# Patient Record
Sex: Male | Born: 1935
Health system: Southern US, Community
[De-identification: ages and names within clinical notes are randomized; demographics above are authoritative.]

## PROBLEM LIST (undated history)

## (undated) DIAGNOSIS — E785 Hyperlipidemia, unspecified: Secondary | ICD-10-CM

## (undated) DIAGNOSIS — I4510 Unspecified right bundle-branch block: Secondary | ICD-10-CM

## (undated) DIAGNOSIS — I6529 Occlusion and stenosis of unspecified carotid artery: Secondary | ICD-10-CM

## (undated) HISTORY — DX: Unspecified right bundle-branch block: I45.10

## (undated) HISTORY — DX: Hyperlipidemia, unspecified: E78.5

## (undated) HISTORY — DX: Occlusion and stenosis of unspecified carotid artery: I65.29

---

## 2002-11-12 ENCOUNTER — Encounter: Payer: Self-pay | Admitting: Emergency Medicine

## 2002-11-12 ENCOUNTER — Emergency Department (HOSPITAL_COMMUNITY): Admission: EM | Admit: 2002-11-12 | Discharge: 2002-11-13 | Payer: Self-pay | Admitting: Emergency Medicine

## 2004-04-09 ENCOUNTER — Encounter: Admission: RE | Admit: 2004-04-09 | Discharge: 2004-04-09 | Payer: Self-pay | Admitting: Internal Medicine

## 2004-07-01 ENCOUNTER — Encounter: Admission: RE | Admit: 2004-07-01 | Discharge: 2004-07-01 | Payer: Self-pay | Admitting: Internal Medicine

## 2004-12-31 ENCOUNTER — Ambulatory Visit (HOSPITAL_COMMUNITY): Admission: RE | Admit: 2004-12-31 | Discharge: 2004-12-31 | Payer: Self-pay | Admitting: Gastroenterology

## 2011-11-24 DIAGNOSIS — H43399 Other vitreous opacities, unspecified eye: Secondary | ICD-10-CM | POA: Diagnosis not present

## 2011-11-24 DIAGNOSIS — H2510 Age-related nuclear cataract, unspecified eye: Secondary | ICD-10-CM | POA: Diagnosis not present

## 2011-11-24 DIAGNOSIS — H35359 Cystoid macular degeneration, unspecified eye: Secondary | ICD-10-CM | POA: Diagnosis not present

## 2012-01-18 DIAGNOSIS — L570 Actinic keratosis: Secondary | ICD-10-CM | POA: Diagnosis not present

## 2012-01-18 DIAGNOSIS — D235 Other benign neoplasm of skin of trunk: Secondary | ICD-10-CM | POA: Diagnosis not present

## 2012-01-27 DIAGNOSIS — R7301 Impaired fasting glucose: Secondary | ICD-10-CM | POA: Diagnosis not present

## 2012-01-27 DIAGNOSIS — I6529 Occlusion and stenosis of unspecified carotid artery: Secondary | ICD-10-CM | POA: Diagnosis not present

## 2012-01-27 DIAGNOSIS — E785 Hyperlipidemia, unspecified: Secondary | ICD-10-CM | POA: Diagnosis not present

## 2012-01-27 DIAGNOSIS — N529 Male erectile dysfunction, unspecified: Secondary | ICD-10-CM | POA: Diagnosis not present

## 2012-01-31 DIAGNOSIS — Z125 Encounter for screening for malignant neoplasm of prostate: Secondary | ICD-10-CM | POA: Diagnosis not present

## 2012-02-14 DIAGNOSIS — Z0000 Encounter for general adult medical examination without abnormal findings: Secondary | ICD-10-CM | POA: Diagnosis not present

## 2012-02-14 DIAGNOSIS — E785 Hyperlipidemia, unspecified: Secondary | ICD-10-CM | POA: Diagnosis not present

## 2012-02-14 DIAGNOSIS — Z1212 Encounter for screening for malignant neoplasm of rectum: Secondary | ICD-10-CM | POA: Diagnosis not present

## 2012-02-14 DIAGNOSIS — I6529 Occlusion and stenosis of unspecified carotid artery: Secondary | ICD-10-CM | POA: Diagnosis not present

## 2012-02-14 DIAGNOSIS — Z125 Encounter for screening for malignant neoplasm of prostate: Secondary | ICD-10-CM | POA: Diagnosis not present

## 2012-02-14 DIAGNOSIS — R7301 Impaired fasting glucose: Secondary | ICD-10-CM | POA: Diagnosis not present

## 2012-02-24 DIAGNOSIS — I6529 Occlusion and stenosis of unspecified carotid artery: Secondary | ICD-10-CM | POA: Diagnosis not present

## 2012-02-24 DIAGNOSIS — E782 Mixed hyperlipidemia: Secondary | ICD-10-CM | POA: Diagnosis not present

## 2012-06-04 DIAGNOSIS — H2510 Age-related nuclear cataract, unspecified eye: Secondary | ICD-10-CM | POA: Diagnosis not present

## 2012-06-08 DIAGNOSIS — H2510 Age-related nuclear cataract, unspecified eye: Secondary | ICD-10-CM | POA: Diagnosis not present

## 2012-06-08 DIAGNOSIS — IMO0002 Reserved for concepts with insufficient information to code with codable children: Secondary | ICD-10-CM | POA: Diagnosis not present

## 2012-06-15 DIAGNOSIS — H2510 Age-related nuclear cataract, unspecified eye: Secondary | ICD-10-CM | POA: Diagnosis not present

## 2012-07-06 DIAGNOSIS — H2589 Other age-related cataract: Secondary | ICD-10-CM | POA: Diagnosis not present

## 2012-07-06 DIAGNOSIS — H2510 Age-related nuclear cataract, unspecified eye: Secondary | ICD-10-CM | POA: Diagnosis not present

## 2012-07-25 DIAGNOSIS — D235 Other benign neoplasm of skin of trunk: Secondary | ICD-10-CM | POA: Diagnosis not present

## 2012-07-25 DIAGNOSIS — L570 Actinic keratosis: Secondary | ICD-10-CM | POA: Diagnosis not present

## 2013-01-18 DIAGNOSIS — H35359 Cystoid macular degeneration, unspecified eye: Secondary | ICD-10-CM | POA: Diagnosis not present

## 2013-01-18 DIAGNOSIS — H26499 Other secondary cataract, unspecified eye: Secondary | ICD-10-CM | POA: Diagnosis not present

## 2013-01-23 DIAGNOSIS — H26499 Other secondary cataract, unspecified eye: Secondary | ICD-10-CM | POA: Diagnosis not present

## 2013-02-08 DIAGNOSIS — D235 Other benign neoplasm of skin of trunk: Secondary | ICD-10-CM | POA: Diagnosis not present

## 2013-02-08 DIAGNOSIS — L570 Actinic keratosis: Secondary | ICD-10-CM | POA: Diagnosis not present

## 2013-02-11 DIAGNOSIS — R7301 Impaired fasting glucose: Secondary | ICD-10-CM | POA: Diagnosis not present

## 2013-02-11 DIAGNOSIS — Z125 Encounter for screening for malignant neoplasm of prostate: Secondary | ICD-10-CM | POA: Diagnosis not present

## 2013-02-11 DIAGNOSIS — E785 Hyperlipidemia, unspecified: Secondary | ICD-10-CM | POA: Diagnosis not present

## 2013-02-18 DIAGNOSIS — M542 Cervicalgia: Secondary | ICD-10-CM | POA: Diagnosis not present

## 2013-02-18 DIAGNOSIS — J309 Allergic rhinitis, unspecified: Secondary | ICD-10-CM | POA: Diagnosis not present

## 2013-02-18 DIAGNOSIS — H26499 Other secondary cataract, unspecified eye: Secondary | ICD-10-CM | POA: Diagnosis not present

## 2013-02-18 DIAGNOSIS — H43819 Vitreous degeneration, unspecified eye: Secondary | ICD-10-CM | POA: Diagnosis not present

## 2013-02-18 DIAGNOSIS — R7301 Impaired fasting glucose: Secondary | ICD-10-CM | POA: Diagnosis not present

## 2013-02-18 DIAGNOSIS — Z0000 Encounter for general adult medical examination without abnormal findings: Secondary | ICD-10-CM | POA: Diagnosis not present

## 2013-02-18 DIAGNOSIS — Z1331 Encounter for screening for depression: Secondary | ICD-10-CM | POA: Diagnosis not present

## 2013-02-18 DIAGNOSIS — E785 Hyperlipidemia, unspecified: Secondary | ICD-10-CM | POA: Diagnosis not present

## 2013-02-18 DIAGNOSIS — I6529 Occlusion and stenosis of unspecified carotid artery: Secondary | ICD-10-CM | POA: Diagnosis not present

## 2013-02-20 DIAGNOSIS — Z1212 Encounter for screening for malignant neoplasm of rectum: Secondary | ICD-10-CM | POA: Diagnosis not present

## 2013-02-25 ENCOUNTER — Other Ambulatory Visit (HOSPITAL_COMMUNITY): Payer: Self-pay | Admitting: Cardiovascular Disease

## 2013-02-25 ENCOUNTER — Ambulatory Visit (HOSPITAL_COMMUNITY)
Admission: RE | Admit: 2013-02-25 | Discharge: 2013-02-25 | Disposition: A | Discharge status: Home / Self Care | Payer: Medicare Other | Source: Ambulatory Visit | Attending: Cardiovascular Disease | Admitting: Cardiovascular Disease

## 2013-02-25 DIAGNOSIS — I658 Occlusion and stenosis of other precerebral arteries: Secondary | ICD-10-CM | POA: Insufficient documentation

## 2013-02-25 DIAGNOSIS — M545 Low back pain: Secondary | ICD-10-CM | POA: Diagnosis not present

## 2013-02-25 DIAGNOSIS — I6529 Occlusion and stenosis of unspecified carotid artery: Secondary | ICD-10-CM | POA: Insufficient documentation

## 2013-02-25 DIAGNOSIS — I6523 Occlusion and stenosis of bilateral carotid arteries: Secondary | ICD-10-CM

## 2013-02-25 DIAGNOSIS — M549 Dorsalgia, unspecified: Secondary | ICD-10-CM | POA: Diagnosis not present

## 2013-02-25 NOTE — Progress Notes (Signed)
Carotid artery duplex doppler was completed. Xavi Tomasik RVT 

## 2013-03-26 DIAGNOSIS — M545 Low back pain: Secondary | ICD-10-CM | POA: Diagnosis not present

## 2013-04-08 ENCOUNTER — Ambulatory Visit (INDEPENDENT_AMBULATORY_CARE_PROVIDER_SITE_OTHER): Payer: Medicare Other | Admitting: Cardiovascular Disease

## 2013-04-08 ENCOUNTER — Encounter: Payer: Self-pay | Admitting: Cardiovascular Disease

## 2013-04-08 VITALS — BP 158/98 | HR 69 | Ht 67.0 in | Wt 147.0 lb

## 2013-04-08 DIAGNOSIS — I779 Disorder of arteries and arterioles, unspecified: Secondary | ICD-10-CM

## 2013-04-08 DIAGNOSIS — I6529 Occlusion and stenosis of unspecified carotid artery: Secondary | ICD-10-CM

## 2013-04-08 DIAGNOSIS — E785 Hyperlipidemia, unspecified: Secondary | ICD-10-CM | POA: Diagnosis not present

## 2013-04-08 DIAGNOSIS — I6521 Occlusion and stenosis of right carotid artery: Secondary | ICD-10-CM

## 2013-04-08 MED ORDER — CLOPIDOGREL BISULFATE 75 MG PO TABS: 75.0000 mg | ORAL_TABLET | Freq: Every day | ORAL | Status: DC

## 2013-04-08 NOTE — Assessment & Plan Note (Signed)
His last carotid Doppler performed 02/25/13 showed moderate right ICA stenosis unchanged from his prior study a year before. He is neurologically a symptomatic on aspirin and Plavix

## 2013-04-08 NOTE — Progress Notes (Signed)
04/08/2013 Theodore Allen   12/21/1935  [id number]  Primary Physician Kari Baars, MD Primary Cardiologist: Runell Gess MD Theodore Allen   HPI:  The patient is a delightful 77 year old fit-appearing married Caucasian male, father of 4, grandfather to 5 grandchildren, whom I saw a year ago. We have been following an asymptomatic right internal carotid artery stenosis by duplex ultrasound on an annual basis. This has remained remarkably stable and he is neurologically asymptomatic. His other problems include hyperlipidemia. Recent lab work performed by Dr. Clelia Croft revealed total cholesterol of 149, LDL of 82 and HDL of 58. He denies chest pain or shortness of breath. His last Myoview performed 5 years ago was normal.       Current Outpatient Prescriptions  Medication Sig Dispense Refill  . Ascorbic Acid (VITAMIN C) 500 MG CAPS Take 500 mg by mouth daily.      Marland Kitchen aspirin 81 MG tablet Take 81 mg by mouth daily.      . Cinnamon 500 MG TABS Take 500 mg by mouth daily.      . clobetasol (TEMOVATE) 0.05 % GEL 1 application as needed.      . clopidogrel (PLAVIX) 75 MG tablet Take 1 tablet (75 mg total) by mouth daily.  90 tablet  3  . Coenzyme Q10 (COQ10 PO) Take by mouth.      . fexofenadine (ALLEGRA) 180 MG tablet Take 180 mg by mouth daily.      . Glucosamine-Chondroitin (GLUCOSAMINE CHONDR COMPLEX PO) Take by mouth. Take 75/100 mg one tablet daily.      . Multiple Vitamin (MULTIVITAMIN) tablet Take 1 tablet by mouth daily.      . Omega-3 Fatty Acids (OMEGA 3 PO) Take 2 g by mouth daily.      . rosuvastatin (CRESTOR) 20 MG tablet Take 20 mg by mouth daily.      . traMADol (ULTRAM) 50 MG tablet 50 mg as needed.      . vitamin E 400 UNIT capsule Take 400 Units by mouth daily.       No current facility-administered medications for this visit.    No Known Allergies  History   Social History  . Marital Status: Married    Spouse Name: N/A    Number of Children: N/A  . Years of  Education: N/A   Occupational History  . Not on file.   Social History Main Topics  . Smoking status: Never Smoker   . Smokeless tobacco: Not on file  . Alcohol Use: .5 - 1 oz/week    1-2 drink(s) per week  . Drug Use: Not on file  . Sexually Active: Not on file   Other Topics Concern  . Not on file   Social History Narrative  . No narrative on file     Review of Systems: General: negative for chills, fever, night sweats or weight changes.  Cardiovascular: negative for chest pain, dyspnea on exertion, edema, orthopnea, palpitations, paroxysmal nocturnal dyspnea or shortness of breath Dermatological: negative for rash Respiratory: negative for cough or wheezing Urologic: negative for hematuria Abdominal: negative for nausea, vomiting, diarrhea, bright red blood per rectum, melena, or hematemesis Neurologic: negative for visual changes, syncope, or dizziness All other systems reviewed and are otherwise negative except as noted above.    Blood pressure 158/98, pulse 69, height 5\' 7"  (1.702 m), weight 147 lb (66.679 kg). Marland Kitchen  HEENT normocephalic and atraumatic  Neck: 2+carotids with bruits bilaterally  Heart: Regular rate and rhythm without murmurs gallops  rubs or clicks   Extremities: No edema .   EKG normal sinus rhythm at 69 without ST or T wave changes  ASSESSMENT AND PLAN:   Carotid artery disease His last carotid Doppler performed 02/25/13 showed moderate right ICA stenosis unchanged from his prior study a year before. He is neurologically a symptomatic on aspirin and Plavix  Hyperlipidemia Well-controlled on Crestor 20 mg daily. His last lipid profile performed by Dr. Clelia Croft 02/20/13 revealed blood pressure 149, LDL of 82 and HDL of 58      Runell Gess MD [facility], [facility] 04/08/2013 3:40 PM

## 2013-04-08 NOTE — Assessment & Plan Note (Signed)
Well-controlled on Crestor 20 mg daily. His last lipid profile performed by Dr. Clelia Croft 02/20/13 revealed blood pressure 149, LDL of 82 and HDL of 58

## 2013-04-08 NOTE — Patient Instructions (Signed)
  Your physician wants you to follow-up with him in : 12 MONTHS                                              You will receive a reminder letter in the mail one month in advance. If you don't receive a letter, please call our office to schedule the follow-up appointment.  NO CHANGE IN YOUR MEDICATION  Your physician has ordered the following tests: CAROTID DOPPLER

## 2013-04-24 DIAGNOSIS — R634 Abnormal weight loss: Secondary | ICD-10-CM | POA: Diagnosis not present

## 2013-04-24 DIAGNOSIS — IMO0002 Reserved for concepts with insufficient information to code with codable children: Secondary | ICD-10-CM | POA: Diagnosis not present

## 2013-05-06 DIAGNOSIS — M545 Low back pain: Secondary | ICD-10-CM | POA: Diagnosis not present

## 2013-05-13 DIAGNOSIS — M545 Low back pain: Secondary | ICD-10-CM | POA: Diagnosis not present

## 2013-05-13 DIAGNOSIS — M5126 Other intervertebral disc displacement, lumbar region: Secondary | ICD-10-CM | POA: Diagnosis not present

## 2013-05-13 DIAGNOSIS — M48061 Spinal stenosis, lumbar region without neurogenic claudication: Secondary | ICD-10-CM | POA: Diagnosis not present

## 2013-05-20 DIAGNOSIS — M545 Low back pain: Secondary | ICD-10-CM | POA: Diagnosis not present

## 2013-05-27 DIAGNOSIS — M545 Low back pain: Secondary | ICD-10-CM | POA: Diagnosis not present

## 2013-05-29 DIAGNOSIS — M545 Low back pain: Secondary | ICD-10-CM | POA: Diagnosis not present

## 2013-06-10 DIAGNOSIS — M545 Low back pain: Secondary | ICD-10-CM | POA: Diagnosis not present

## 2013-08-12 DIAGNOSIS — D235 Other benign neoplasm of skin of trunk: Secondary | ICD-10-CM | POA: Diagnosis not present

## 2013-08-12 DIAGNOSIS — L570 Actinic keratosis: Secondary | ICD-10-CM | POA: Diagnosis not present

## 2013-10-09 DIAGNOSIS — H26499 Other secondary cataract, unspecified eye: Secondary | ICD-10-CM | POA: Diagnosis not present

## 2014-02-12 DIAGNOSIS — N183 Chronic kidney disease, stage 3 unspecified: Secondary | ICD-10-CM | POA: Diagnosis not present

## 2014-02-12 DIAGNOSIS — Z7902 Long term (current) use of antithrombotics/antiplatelets: Secondary | ICD-10-CM | POA: Diagnosis not present

## 2014-02-12 DIAGNOSIS — E785 Hyperlipidemia, unspecified: Secondary | ICD-10-CM | POA: Diagnosis not present

## 2014-02-12 DIAGNOSIS — R7301 Impaired fasting glucose: Secondary | ICD-10-CM | POA: Diagnosis not present

## 2014-02-19 DIAGNOSIS — Z79899 Other long term (current) drug therapy: Secondary | ICD-10-CM | POA: Diagnosis not present

## 2014-02-19 DIAGNOSIS — I6529 Occlusion and stenosis of unspecified carotid artery: Secondary | ICD-10-CM | POA: Diagnosis not present

## 2014-02-19 DIAGNOSIS — Z1331 Encounter for screening for depression: Secondary | ICD-10-CM | POA: Diagnosis not present

## 2014-02-19 DIAGNOSIS — R7301 Impaired fasting glucose: Secondary | ICD-10-CM | POA: Diagnosis not present

## 2014-02-19 DIAGNOSIS — Z0000 Encounter for general adult medical examination without abnormal findings: Secondary | ICD-10-CM | POA: Diagnosis not present

## 2014-02-19 DIAGNOSIS — J309 Allergic rhinitis, unspecified: Secondary | ICD-10-CM | POA: Diagnosis not present

## 2014-02-19 DIAGNOSIS — N529 Male erectile dysfunction, unspecified: Secondary | ICD-10-CM | POA: Diagnosis not present

## 2014-02-19 DIAGNOSIS — E785 Hyperlipidemia, unspecified: Secondary | ICD-10-CM | POA: Diagnosis not present

## 2014-02-20 ENCOUNTER — Telehealth (HOSPITAL_COMMUNITY): Payer: Self-pay | Admitting: *Deleted

## 2014-02-24 DIAGNOSIS — Z1212 Encounter for screening for malignant neoplasm of rectum: Secondary | ICD-10-CM | POA: Diagnosis not present

## 2014-03-03 DIAGNOSIS — Z23 Encounter for immunization: Secondary | ICD-10-CM | POA: Diagnosis not present

## 2014-03-03 DIAGNOSIS — D235 Other benign neoplasm of skin of trunk: Secondary | ICD-10-CM | POA: Diagnosis not present

## 2014-03-03 DIAGNOSIS — L570 Actinic keratosis: Secondary | ICD-10-CM | POA: Diagnosis not present

## 2014-03-04 ENCOUNTER — Ambulatory Visit (HOSPITAL_COMMUNITY)
Admission: RE | Admit: 2014-03-04 | Discharge: 2014-03-04 | Disposition: A | Discharge status: Home / Self Care | Payer: Medicare Other | Source: Ambulatory Visit | Attending: Cardiovascular Disease | Admitting: Cardiovascular Disease

## 2014-03-04 DIAGNOSIS — I6529 Occlusion and stenosis of unspecified carotid artery: Secondary | ICD-10-CM

## 2014-03-04 DIAGNOSIS — I6521 Occlusion and stenosis of right carotid artery: Secondary | ICD-10-CM

## 2014-03-04 NOTE — Progress Notes (Signed)
Carotid Duplex Completed. °Brianna L Mazza,RVT °

## 2014-03-06 ENCOUNTER — Other Ambulatory Visit: Payer: Self-pay | Admitting: *Deleted

## 2014-03-06 DIAGNOSIS — I6521 Occlusion and stenosis of right carotid artery: Secondary | ICD-10-CM

## 2014-03-06 MED ORDER — CLOPIDOGREL BISULFATE 75 MG PO TABS: 75.0000 mg | ORAL_TABLET | Freq: Every day | ORAL | Status: DC

## 2014-03-06 NOTE — Telephone Encounter (Signed)
Rx was sent to pharmacy electronically. 

## 2014-04-01 ENCOUNTER — Ambulatory Visit: Payer: Medicare Other | Admitting: Cardiovascular Disease

## 2014-05-30 ENCOUNTER — Ambulatory Visit (INDEPENDENT_AMBULATORY_CARE_PROVIDER_SITE_OTHER): Payer: Medicare Other | Admitting: Cardiovascular Disease

## 2014-05-30 ENCOUNTER — Encounter: Payer: Self-pay | Admitting: Cardiovascular Disease

## 2014-05-30 VITALS — BP 138/80 | HR 53 | Ht 67.0 in | Wt 155.0 lb

## 2014-05-30 DIAGNOSIS — I779 Disorder of arteries and arterioles, unspecified: Secondary | ICD-10-CM | POA: Diagnosis not present

## 2014-05-30 DIAGNOSIS — I739 Peripheral vascular disease, unspecified: Secondary | ICD-10-CM

## 2014-05-30 DIAGNOSIS — I6529 Occlusion and stenosis of unspecified carotid artery: Secondary | ICD-10-CM | POA: Diagnosis not present

## 2014-05-30 DIAGNOSIS — E785 Hyperlipidemia, unspecified: Secondary | ICD-10-CM

## 2014-05-30 DIAGNOSIS — I4510 Unspecified right bundle-branch block: Secondary | ICD-10-CM

## 2014-05-30 DIAGNOSIS — I6521 Occlusion and stenosis of right carotid artery: Secondary | ICD-10-CM

## 2014-05-30 MED ORDER — CLOPIDOGREL BISULFATE 75 MG PO TABS: 75.0000 mg | ORAL_TABLET | Freq: Every day | ORAL | Status: DC

## 2014-05-30 NOTE — Assessment & Plan Note (Signed)
Moderate right internal carotid artery stenosis which has been remained stable since his most recent carotid duplex study performed 03/04/14.

## 2014-05-30 NOTE — Patient Instructions (Signed)
Your physician recommends that you schedule a follow-up appointment in: ONE YEAR with [facility]  Your physician has requested that you have a carotid duplex in April 2016 This test is an ultrasound of the carotid arteries in your neck. It looks at blood flow through these arteries that supply the brain with blood. Allow one hour for this exam. There are no restrictions or special instructions.

## 2014-05-30 NOTE — Assessment & Plan Note (Signed)
New since his last office visit.

## 2014-05-30 NOTE — Assessment & Plan Note (Signed)
On statin therapy followed by his PCP 

## 2014-05-30 NOTE — Progress Notes (Signed)
05/30/2014 Theodore Allen   03/02/1936  [id number]  Primary Physician Marton Redwood, MD Primary Cardiologist: Lorretta Harp MD Renae Gloss   HPI:  The patient is a delightful 78 year old fit-appearing married Caucasian male, father of 40, grandfather to 5 grandchildren, whom I saw a year ago. We have been following an asymptomatic right internal carotid artery stenosis by duplex ultrasound on an annual basis. This has remained remarkably stable and he is neurologically asymptomatic. His other problems include hyperlipidemia. He denies chest pain or shortness of breath. His last Myoview performed 11/04/08 was normal. Since I saw him last he has developed a new right branch block.     Current Outpatient Prescriptions  Medication Sig Dispense Refill  . Ascorbic Acid (VITAMIN C) 500 MG CAPS Take 500 mg by mouth daily.      Marland Kitchen aspirin 81 MG tablet Take 81 mg by mouth daily.      . Cinnamon 500 MG TABS Take 500 mg by mouth daily.      . clobetasol (TEMOVATE) 6.29 % GEL 1 application as needed.      . clopidogrel (PLAVIX) 75 MG tablet Take 1 tablet (75 mg total) by mouth daily.  90 tablet  0  . Coenzyme Q10 (COQ10 PO) Take by mouth.      . fexofenadine (ALLEGRA) 180 MG tablet Take 180 mg by mouth daily.      . Glucosamine-Chondroitin (GLUCOSAMINE CHONDR COMPLEX PO) Take by mouth. Take 75/100 mg one tablet daily.      . Multiple Vitamin (MULTIVITAMIN) tablet Take 1 tablet by mouth daily.      . Omega-3 Fatty Acids (OMEGA 3 PO) Take 2 g by mouth daily.      . rosuvastatin (CRESTOR) 20 MG tablet Take 20 mg by mouth daily.      . traMADol (ULTRAM) 50 MG tablet 50 mg as needed.      . vitamin E 400 UNIT capsule Take 400 Units by mouth daily.       No current facility-administered medications for this visit.    No Known Allergies  History   Social History  . Marital Status: Married    Spouse Name: N/A    Number of Children: N/A  . Years of Education: N/A   Occupational  History  . Not on file.   Social History Main Topics  . Smoking status: Never Smoker   . Smokeless tobacco: Not on file  . Alcohol Use: 0.5 - 1.0 oz/week    1-2 drink(s) per week  . Drug Use: Not on file  . Sexual Activity: Not on file   Other Topics Concern  . Not on file   Social History Narrative  . No narrative on file     Review of Systems: General: negative for chills, fever, night sweats or weight changes.  Cardiovascular: negative for chest pain, dyspnea on exertion, edema, orthopnea, palpitations, paroxysmal nocturnal dyspnea or shortness of breath Dermatological: negative for rash Respiratory: negative for cough or wheezing Urologic: negative for hematuria Abdominal: negative for nausea, vomiting, diarrhea, bright red blood per rectum, melena, or hematemesis Neurologic: negative for visual changes, syncope, or dizziness All other systems reviewed and are otherwise negative except as noted above.    Blood pressure 138/80, pulse 53, height 5\' 7"  (1.702 m), weight 155 lb (70.308 kg).  General appearance: alert and no distress Neck: no adenopathy, no carotid bruit, no JVD, supple, symmetrical, trachea midline and thyroid not enlarged, symmetric, no tenderness/mass/nodules Lungs: clear  to auscultation bilaterally Heart: regular rate and rhythm, S1, S2 normal, no murmur, click, rub or gallop Extremities: extremities normal, atraumatic, no cyanosis or edema  EKG normal sinus rhythm at 68 with right bundle branch block new since his last tracing.  ASSESSMENT AND PLAN:   Hyperlipidemia On statin therapy followed by his PCP  Carotid artery disease Moderate right internal carotid artery stenosis which has been remained stable since his most recent carotid duplex study performed 03/04/14.  Right bundle branch block New since his last office visit.      Lorretta Harp MD FACP,FACC,FAHA, [facility] 05/30/2014 3:19 PM

## 2014-09-01 DIAGNOSIS — L570 Actinic keratosis: Secondary | ICD-10-CM | POA: Diagnosis not present

## 2014-09-01 DIAGNOSIS — D225 Melanocytic nevi of trunk: Secondary | ICD-10-CM | POA: Diagnosis not present

## 2014-12-01 ENCOUNTER — Other Ambulatory Visit: Payer: Self-pay | Admitting: Cardiovascular Disease

## 2014-12-01 DIAGNOSIS — I6521 Occlusion and stenosis of right carotid artery: Secondary | ICD-10-CM

## 2014-12-01 MED ORDER — CLOPIDOGREL BISULFATE 75 MG PO TABS: 75.0000 mg | ORAL_TABLET | Freq: Every day | ORAL | Status: DC

## 2014-12-01 NOTE — Telephone Encounter (Signed)
Pt need new prescription for his Plavix #90 and refills his insurance changed. Please call or send this to [facility].

## 2014-12-01 NOTE — Telephone Encounter (Signed)
Rx(s) sent to pharmacy electronically.  

## 2015-02-18 ENCOUNTER — Telehealth (HOSPITAL_COMMUNITY): Payer: Self-pay | Admitting: *Deleted

## 2015-02-19 DIAGNOSIS — R7301 Impaired fasting glucose: Secondary | ICD-10-CM | POA: Diagnosis not present

## 2015-02-19 DIAGNOSIS — Z0000 Encounter for general adult medical examination without abnormal findings: Secondary | ICD-10-CM | POA: Diagnosis not present

## 2015-02-19 DIAGNOSIS — E785 Hyperlipidemia, unspecified: Secondary | ICD-10-CM | POA: Diagnosis not present

## 2015-02-19 DIAGNOSIS — Z125 Encounter for screening for malignant neoplasm of prostate: Secondary | ICD-10-CM | POA: Diagnosis not present

## 2015-02-26 DIAGNOSIS — E785 Hyperlipidemia, unspecified: Secondary | ICD-10-CM | POA: Diagnosis not present

## 2015-02-26 DIAGNOSIS — M5416 Radiculopathy, lumbar region: Secondary | ICD-10-CM | POA: Diagnosis not present

## 2015-02-26 DIAGNOSIS — J309 Allergic rhinitis, unspecified: Secondary | ICD-10-CM | POA: Diagnosis not present

## 2015-02-26 DIAGNOSIS — Z125 Encounter for screening for malignant neoplasm of prostate: Secondary | ICD-10-CM | POA: Diagnosis not present

## 2015-02-26 DIAGNOSIS — R7301 Impaired fasting glucose: Secondary | ICD-10-CM | POA: Diagnosis not present

## 2015-02-26 DIAGNOSIS — N529 Male erectile dysfunction, unspecified: Secondary | ICD-10-CM | POA: Diagnosis not present

## 2015-02-26 DIAGNOSIS — Z0000 Encounter for general adult medical examination without abnormal findings: Secondary | ICD-10-CM | POA: Diagnosis not present

## 2015-02-26 DIAGNOSIS — I6529 Occlusion and stenosis of unspecified carotid artery: Secondary | ICD-10-CM | POA: Diagnosis not present

## 2015-02-26 DIAGNOSIS — Z6824 Body mass index (BMI) 24.0-24.9, adult: Secondary | ICD-10-CM | POA: Diagnosis not present

## 2015-02-26 DIAGNOSIS — Z1389 Encounter for screening for other disorder: Secondary | ICD-10-CM | POA: Diagnosis not present

## 2015-02-27 DIAGNOSIS — Z1212 Encounter for screening for malignant neoplasm of rectum: Secondary | ICD-10-CM | POA: Diagnosis not present

## 2015-03-02 ENCOUNTER — Ambulatory Visit (HOSPITAL_COMMUNITY)
Admission: RE | Admit: 2015-03-02 | Discharge: 2015-03-02 | Disposition: A | Discharge status: Home / Self Care | Payer: Medicare Other | Source: Ambulatory Visit | Attending: Cardiology | Admitting: Cardiology

## 2015-03-02 DIAGNOSIS — I779 Disorder of arteries and arterioles, unspecified: Secondary | ICD-10-CM | POA: Diagnosis not present

## 2015-03-02 DIAGNOSIS — I6521 Occlusion and stenosis of right carotid artery: Secondary | ICD-10-CM | POA: Diagnosis not present

## 2015-03-02 DIAGNOSIS — I739 Peripheral vascular disease, unspecified: Secondary | ICD-10-CM

## 2015-03-02 NOTE — Progress Notes (Signed)
Carotid Duplex Completed. Stable 50-69% right sided ICA stenosis.  Oda Cogan, BS, RDMS, RVT

## 2015-03-04 ENCOUNTER — Encounter: Payer: Self-pay | Admitting: *Deleted

## 2015-03-04 DIAGNOSIS — L570 Actinic keratosis: Secondary | ICD-10-CM | POA: Diagnosis not present

## 2015-03-04 DIAGNOSIS — L820 Inflamed seborrheic keratosis: Secondary | ICD-10-CM | POA: Diagnosis not present

## 2015-03-09 ENCOUNTER — Encounter: Payer: Self-pay | Admitting: Cardiovascular Disease

## 2015-03-13 ENCOUNTER — Emergency Department (HOSPITAL_COMMUNITY): Payer: Medicare Other | Admitting: Anesthesiology

## 2015-03-13 ENCOUNTER — Inpatient Hospital Stay (HOSPITAL_COMMUNITY)
Admission: EM | Admit: 2015-03-13 | Discharge: 2015-03-16 | DRG: 854 | Disposition: A | Discharge status: Home / Self Care | Payer: Medicare Other | Attending: Orthopedic Surgery | Admitting: Orthopedic Surgery

## 2015-03-13 ENCOUNTER — Emergency Department (HOSPITAL_COMMUNITY): Payer: Medicare Other

## 2015-03-13 ENCOUNTER — Encounter (HOSPITAL_COMMUNITY): Payer: Self-pay | Admitting: Emergency Medicine

## 2015-03-13 ENCOUNTER — Encounter (HOSPITAL_COMMUNITY)
Admission: EM | Disposition: A | Discharge status: Home / Self Care | Payer: Self-pay | Source: Home / Self Care | Attending: Orthopedic Surgery

## 2015-03-13 DIAGNOSIS — M659 Synovitis and tenosynovitis, unspecified: Secondary | ICD-10-CM | POA: Diagnosis present

## 2015-03-13 DIAGNOSIS — M65141 Other infective (teno)synovitis, right hand: Secondary | ICD-10-CM | POA: Diagnosis not present

## 2015-03-13 DIAGNOSIS — I6529 Occlusion and stenosis of unspecified carotid artery: Secondary | ICD-10-CM | POA: Diagnosis present

## 2015-03-13 DIAGNOSIS — B951 Streptococcus, group B, as the cause of diseases classified elsewhere: Secondary | ICD-10-CM | POA: Diagnosis present

## 2015-03-13 DIAGNOSIS — E785 Hyperlipidemia, unspecified: Secondary | ICD-10-CM | POA: Diagnosis present

## 2015-03-13 DIAGNOSIS — L02511 Cutaneous abscess of right hand: Secondary | ICD-10-CM | POA: Diagnosis present

## 2015-03-13 DIAGNOSIS — M009 Pyogenic arthritis, unspecified: Secondary | ICD-10-CM

## 2015-03-13 DIAGNOSIS — L03011 Cellulitis of right finger: Secondary | ICD-10-CM | POA: Diagnosis present

## 2015-03-13 DIAGNOSIS — A401 Sepsis due to streptococcus, group B: Principal | ICD-10-CM | POA: Diagnosis present

## 2015-03-13 DIAGNOSIS — I4510 Unspecified right bundle-branch block: Secondary | ICD-10-CM | POA: Diagnosis present

## 2015-03-13 DIAGNOSIS — M00841 Arthritis due to other bacteria, right hand: Secondary | ICD-10-CM | POA: Diagnosis not present

## 2015-03-13 DIAGNOSIS — M65841 Other synovitis and tenosynovitis, right hand: Secondary | ICD-10-CM | POA: Diagnosis not present

## 2015-03-13 DIAGNOSIS — A419 Sepsis, unspecified organism: Secondary | ICD-10-CM | POA: Diagnosis not present

## 2015-03-13 DIAGNOSIS — L729 Follicular cyst of the skin and subcutaneous tissue, unspecified: Secondary | ICD-10-CM | POA: Diagnosis not present

## 2015-03-13 DIAGNOSIS — L089 Local infection of the skin and subcutaneous tissue, unspecified: Secondary | ICD-10-CM | POA: Diagnosis present

## 2015-03-13 DIAGNOSIS — L728 Other follicular cysts of the skin and subcutaneous tissue: Secondary | ICD-10-CM | POA: Diagnosis not present

## 2015-03-13 HISTORY — PX: I&D EXTREMITY: SHX5045

## 2015-03-13 LAB — BASIC METABOLIC PANEL
Anion gap: 9 (ref 5–15)
BUN: 18 mg/dL (ref 6–23)
CO2: 27 mmol/L (ref 19–32)
Calcium: 9.4 mg/dL (ref 8.4–10.5)
Chloride: 103 mmol/L (ref 96–112)
Creatinine, Ser: 0.96 mg/dL (ref 0.50–1.35)
GFR calc Af Amer: >90 mL/min (ref >90)
GFR, EST NON AFRICAN AMERICAN: 79 mL/min — AB (ref >90)
Glucose, Bld: 92 mg/dL (ref 70–99)
Potassium: 4.2 mmol/L (ref 3.5–5.1)
SODIUM: 139 mmol/L (ref 135–145)

## 2015-03-13 LAB — CBC
HEMATOCRIT: 47.1 % (ref 39.0–52.0)
Hemoglobin: 15.5 g/dL (ref 13.0–17.0)
MCH: 30.4 pg (ref 26.0–34.0)
MCHC: 32.9 g/dL (ref 30.0–36.0)
MCV: 92.4 fL (ref 78.0–100.0)
Platelets: 252 10*3/uL (ref 150–400)
RBC: 5.1 MIL/uL (ref 4.22–5.81)
RDW: 13.6 % (ref 11.5–15.5)
WBC: 15.4 10*3/uL — ABNORMAL HIGH (ref 4.0–10.5)

## 2015-03-13 LAB — SEDIMENTATION RATE: SED RATE: 5 mm/h (ref 0–16)

## 2015-03-13 SURGERY — IRRIGATION AND DEBRIDEMENT EXTREMITY
Anesthesia: General | Site: Finger | Laterality: Right

## 2015-03-13 MED ORDER — VITAMIN C 500 MG PO TABS
1000.0000 mg | ORAL_TABLET | Freq: Every day | ORAL | Status: DC
  Administered 2015-03-13 – 2015-03-15 (×3): 1000 mg via ORAL
  Filled 2015-03-13 (×4): qty 2

## 2015-03-13 MED ORDER — ROSUVASTATIN CALCIUM 20 MG PO TABS
20.0000 mg | ORAL_TABLET | Freq: Every day | ORAL | Status: DC
  Administered 2015-03-13 – 2015-03-15 (×3): 20 mg via ORAL
  Filled 2015-03-13 (×4): qty 1

## 2015-03-13 MED ORDER — VANCOMYCIN HCL 1000 MG IV SOLR
1000.0000 mg | INTRAVENOUS | Status: DC | PRN
  Administered 2015-03-13: 1000 mg via INTRAVENOUS

## 2015-03-13 MED ORDER — PROPOFOL 10 MG/ML IV BOLUS
INTRAVENOUS | Status: DC | PRN
  Administered 2015-03-13: 130 mg via INTRAVENOUS

## 2015-03-13 MED ORDER — PIPERACILLIN-TAZOBACTAM 3.375 G IVPB
3.3750 g | Freq: Three times a day (TID) | INTRAVENOUS | Status: DC
  Administered 2015-03-13 – 2015-03-16 (×8): 3.375 g via INTRAVENOUS
  Filled 2015-03-13 (×10): qty 50

## 2015-03-13 MED ORDER — SODIUM CHLORIDE 0.9 % IR SOLN
Status: DC | PRN
  Administered 2015-03-13: 500 mL

## 2015-03-13 MED ORDER — DEXAMETHASONE SODIUM PHOSPHATE 10 MG/ML IJ SOLN
INTRAMUSCULAR | Status: DC | PRN
  Administered 2015-03-13: 10 mg via INTRAVENOUS

## 2015-03-13 MED ORDER — LACTATED RINGERS IV SOLN: INTRAVENOUS | Status: DC

## 2015-03-13 MED ORDER — VANCOMYCIN HCL IN DEXTROSE 1-5 GM/200ML-% IV SOLN
INTRAVENOUS | Status: AC
  Filled 2015-03-13: qty 200

## 2015-03-13 MED ORDER — FENTANYL CITRATE (PF) 100 MCG/2ML IJ SOLN
INTRAMUSCULAR | Status: AC
  Filled 2015-03-13: qty 2

## 2015-03-13 MED ORDER — ONDANSETRON HCL 4 MG/2ML IJ SOLN
INTRAMUSCULAR | Status: DC | PRN
  Administered 2015-03-13: 4 mg via INTRAVENOUS

## 2015-03-13 MED ORDER — LACTATED RINGERS IV SOLN
INTRAVENOUS | Status: DC
Start: 2015-03-13 — End: 2015-03-15
  Administered 2015-03-14: 03:00:00 via INTRAVENOUS

## 2015-03-13 MED ORDER — PROPOFOL 10 MG/ML IV BOLUS
INTRAVENOUS | Status: AC
  Filled 2015-03-13: qty 20

## 2015-03-13 MED ORDER — ONDANSETRON HCL 4 MG PO TABS: 4.0000 mg | ORAL_TABLET | Freq: Four times a day (QID) | ORAL | Status: DC | PRN

## 2015-03-13 MED ORDER — MIDAZOLAM HCL 2 MG/2ML IJ SOLN
INTRAMUSCULAR | Status: AC
  Filled 2015-03-13: qty 2

## 2015-03-13 MED ORDER — DEXAMETHASONE SODIUM PHOSPHATE 10 MG/ML IJ SOLN
INTRAMUSCULAR | Status: AC
  Filled 2015-03-13: qty 1

## 2015-03-13 MED ORDER — BACITRACIN-NEOMYCIN-POLYMYXIN 400-5-5000 EX OINT
TOPICAL_OINTMENT | CUTANEOUS | Status: AC
  Filled 2015-03-13: qty 1

## 2015-03-13 MED ORDER — MORPHINE SULFATE 10 MG/ML IJ SOLN: 1.0000 mg | INTRAMUSCULAR | Status: DC | PRN

## 2015-03-13 MED ORDER — DOCUSATE SODIUM 100 MG PO CAPS
100.0000 mg | ORAL_CAPSULE | Freq: Two times a day (BID) | ORAL | Status: DC
  Administered 2015-03-13 – 2015-03-15 (×4): 100 mg via ORAL
  Filled 2015-03-13 (×7): qty 1

## 2015-03-13 MED ORDER — MIDAZOLAM HCL 5 MG/5ML IJ SOLN
INTRAMUSCULAR | Status: DC | PRN
  Administered 2015-03-13 (×2): 1 mg via INTRAVENOUS

## 2015-03-13 MED ORDER — BUPIVACAINE HCL (PF) 0.5 % IJ SOLN
INTRAMUSCULAR | Status: AC
  Filled 2015-03-13: qty 30

## 2015-03-13 MED ORDER — SODIUM CHLORIDE 0.9 % IR SOLN
Status: AC
  Filled 2015-03-13: qty 1

## 2015-03-13 MED ORDER — ONDANSETRON HCL 4 MG/2ML IJ SOLN
INTRAMUSCULAR | Status: AC
  Filled 2015-03-13: qty 2

## 2015-03-13 MED ORDER — PROMETHAZINE HCL 25 MG RE SUPP: 12.5000 mg | Freq: Four times a day (QID) | RECTAL | Status: DC | PRN

## 2015-03-13 MED ORDER — LIDOCAINE HCL (CARDIAC) 20 MG/ML IV SOLN
INTRAVENOUS | Status: DC | PRN
  Administered 2015-03-13: 20 mg via INTRAVENOUS

## 2015-03-13 MED ORDER — OXYCODONE HCL 5 MG PO TABS
5.0000 mg | ORAL_TABLET | ORAL | Status: DC | PRN
  Administered 2015-03-13 – 2015-03-15 (×6): 5 mg via ORAL
  Filled 2015-03-13 (×6): qty 1

## 2015-03-13 MED ORDER — ALPRAZOLAM 0.5 MG PO TABS: 0.5000 mg | ORAL_TABLET | Freq: Four times a day (QID) | ORAL | Status: DC | PRN

## 2015-03-13 MED ORDER — ONDANSETRON HCL 4 MG/2ML IJ SOLN: 4.0000 mg | Freq: Four times a day (QID) | INTRAMUSCULAR | Status: DC | PRN

## 2015-03-13 MED ORDER — LACTATED RINGERS IV SOLN
INTRAVENOUS | Status: DC | PRN
  Administered 2015-03-13: 18:00:00 via INTRAVENOUS

## 2015-03-13 MED ORDER — FAMOTIDINE 20 MG PO TABS
20.0000 mg | ORAL_TABLET | Freq: Two times a day (BID) | ORAL | Status: DC | PRN
  Filled 2015-03-13: qty 1

## 2015-03-13 MED ORDER — FENTANYL CITRATE (PF) 100 MCG/2ML IJ SOLN
INTRAMUSCULAR | Status: DC | PRN
  Administered 2015-03-13 (×4): 50 ug via INTRAVENOUS

## 2015-03-13 MED ORDER — LIDOCAINE HCL (CARDIAC) 20 MG/ML IV SOLN
INTRAVENOUS | Status: AC
  Filled 2015-03-13: qty 5

## 2015-03-13 MED ORDER — VANCOMYCIN HCL IN DEXTROSE 750-5 MG/150ML-% IV SOLN
750.0000 mg | Freq: Two times a day (BID) | INTRAVENOUS | Status: DC
  Administered 2015-03-14: 750 mg via INTRAVENOUS
  Filled 2015-03-13 (×3): qty 150

## 2015-03-13 MED ORDER — FENTANYL CITRATE (PF) 100 MCG/2ML IJ SOLN: 25.0000 ug | INTRAMUSCULAR | Status: DC | PRN

## 2015-03-13 MED ORDER — METHOCARBAMOL 1000 MG/10ML IJ SOLN
500.0000 mg | Freq: Four times a day (QID) | INTRAVENOUS | Status: DC | PRN
  Filled 2015-03-13: qty 5

## 2015-03-13 MED ORDER — SODIUM CHLORIDE 0.9 % IR SOLN
Status: DC | PRN
  Administered 2015-03-13: 3000 mL

## 2015-03-13 MED ORDER — METHOCARBAMOL 500 MG PO TABS: 500.0000 mg | ORAL_TABLET | Freq: Four times a day (QID) | ORAL | Status: DC | PRN

## 2015-03-13 SURGICAL SUPPLY — 37 items
BAG SPEC THK2 15X12 ZIP CLS (MISCELLANEOUS) ×1
BAG ZIPLOCK 12X15 (MISCELLANEOUS) ×3 IMPLANT
BANDAGE ELASTIC 4 VELCRO ST LF (GAUZE/BANDAGES/DRESSINGS) ×3 IMPLANT
BLADE SURG SZ10 CARB STEEL (BLADE) ×3 IMPLANT
BNDG COHESIVE 3X5 TAN STRL LF (GAUZE/BANDAGES/DRESSINGS) ×2 IMPLANT
BNDG CONFORM 2 STRL LF (GAUZE/BANDAGES/DRESSINGS) ×4 IMPLANT
BNDG CONFORM 3 STRL LF (GAUZE/BANDAGES/DRESSINGS) ×2 IMPLANT
BNDG GAUZE ELAST 4 BULKY (GAUZE/BANDAGES/DRESSINGS) ×3 IMPLANT
CUFF TOURN SGL QUICK 18 (TOURNIQUET CUFF) ×3 IMPLANT
DRAIN PENROSE 18X1/2 LTX STRL (DRAIN) ×3 IMPLANT
DRAPE SURG 17X11 SM STRL (DRAPES) ×3 IMPLANT
DRSG EMULSION OIL 3X3 NADH (GAUZE/BANDAGES/DRESSINGS) ×2 IMPLANT
DRSG PAD ABDOMINAL 8X10 ST (GAUZE/BANDAGES/DRESSINGS) ×3 IMPLANT
ELECT REM PT RETURN 9FT ADLT (ELECTROSURGICAL) ×3
ELECTRODE REM PT RTRN 9FT ADLT (ELECTROSURGICAL) ×1 IMPLANT
GAUZE SPONGE 4X4 12PLY STRL (GAUZE/BANDAGES/DRESSINGS) ×3 IMPLANT
GAUZE XEROFORM 1X8 LF (GAUZE/BANDAGES/DRESSINGS) ×2 IMPLANT
GLOVE BIO SURGEON STRL SZ8 (GLOVE) ×3 IMPLANT
GOWN STRL REUS W/TWL XL LVL3 (GOWN DISPOSABLE) ×3 IMPLANT
KIT BASIN OR (CUSTOM PROCEDURE TRAY) ×3 IMPLANT
MANIFOLD NEPTUNE II (INSTRUMENTS) ×3 IMPLANT
PACK ORTHO EXTREMITY (CUSTOM PROCEDURE TRAY) ×3 IMPLANT
PAD CAST 4YDX4 CTTN HI CHSV (CAST SUPPLIES) ×1 IMPLANT
PADDING CAST COTTON 4X4 STRL (CAST SUPPLIES) ×3
POSITIONER SURGICAL ARM (MISCELLANEOUS) ×3 IMPLANT
SOL PREP POV-IOD 4OZ 10% (MISCELLANEOUS) ×3 IMPLANT
SOL PREP PROV IODINE SCRUB 4OZ (MISCELLANEOUS) ×3 IMPLANT
SPLINT FINGER 3.25 BULB 911905 (SOFTGOODS) ×2 IMPLANT
SUT CHROMIC 5 0 P 3 (SUTURE) ×2 IMPLANT
SUT PROLENE 3 0 PS 2 (SUTURE) ×3 IMPLANT
SUT VIC AB 1 CT1 27 (SUTURE) ×3
SUT VIC AB 1 CT1 27XBRD ANTBC (SUTURE) ×1 IMPLANT
SUT VIC AB 2-0 CT1 27 (SUTURE) ×3
SUT VIC AB 2-0 CT1 27XBRD (SUTURE) ×1 IMPLANT
SYR 20CC LL (SYRINGE) ×3 IMPLANT
SYR CONTROL 10ML LL (SYRINGE) ×3 IMPLANT
TOWEL OR 17X26 10 PK STRL BLUE (TOWEL DISPOSABLE) ×3 IMPLANT

## 2015-03-13 NOTE — Anesthesia Postprocedure Evaluation (Signed)
  Anesthesia Post-op Note  Patient: Theodore Allen  Procedure(s) Performed: Procedure(s) (LRB): IRRIGATION AND DEBRIDEMENT EXTREMITY (Right)  Patient Location: PACU  Anesthesia Type: General  Level of Consciousness: awake and alert   Airway and Oxygen Therapy: Patient Spontanous Breathing  Post-op Pain: mild  Post-op Assessment: Post-op Vital signs reviewed, Patient's Cardiovascular Status Stable, Respiratory Function Stable, Patent Airway and No signs of Nausea or vomiting  Last Vitals:  Filed Vitals:   03/13/15 2106  BP: 120/75  Pulse: 74  Temp: 37 C  Resp: 16    Post-op Vital Signs: stable   Complications: No apparent anesthesia complications

## 2015-03-13 NOTE — Anesthesia Procedure Notes (Signed)
Procedure Name: LMA Insertion Date/Time: 03/13/2015 7:05 PM Performed by: Lajuana Carry E Pre-anesthesia Checklist: Patient identified, Emergency Drugs available, Suction available and Patient being monitored Patient Re-evaluated:Patient Re-evaluated prior to inductionOxygen Delivery Method: Circle system utilized Preoxygenation: Pre-oxygenation with 100% oxygen Intubation Type: IV induction Ventilation: Mask ventilation without difficulty LMA: LMA inserted LMA Size: 4.0 Number of attempts: 1 Placement Confirmation: positive ETCO2 and breath sounds checked- equal and bilateral Dental Injury: Teeth and Oropharynx as per pre-operative assessment

## 2015-03-13 NOTE — Transfer of Care (Signed)
Immediate Anesthesia Transfer of Care Note  Patient: Theodore Allen  Procedure(s) Performed: Procedure(s): IRRIGATION AND DEBRIDEMENT EXTREMITY (Right)  Patient Location: PACU  Anesthesia Type:General  Level of Consciousness:  sedated, patient cooperative and responds to stimulation  Airway & Oxygen Therapy:Patient Spontanous Breathing and Patient connected to face mask oxgen  Post-op Assessment:  Report given to PACU RN and Post -op Vital signs reviewed and stable  Post vital signs:  Reviewed and stable  Last Vitals:  Filed Vitals:   03/13/15 1557  BP: 135/70  Pulse: 80  Temp: 37.1 C  Resp: 18    Complications: No apparent anesthesia complications

## 2015-03-13 NOTE — ED Notes (Addendum)
Pt reports working in the yard yesterday -unsure of exact events. Noticed right hand swelling starting overnight with redness spreading up to forearm. Patient has seen dermatologist in [facility] for 15 years for "a blister that pops up on my right middle finger and has to be lanced and drained continually. The last time he did it was 2 years ago but I am usually the one who does it. I just poke it with a lancet and then drain it. The doctor said these things are a pain and it's hard for them to go away." Last time finger was drained was yesterday before swelling appeared. Unsure if hand was clean. Patient has small brown area noted under nail and on skin on right middle finger. Finger is grossly swollen, reddened, and warm as compared to left fingers. Denies N/V/D/fever. No other c/c.

## 2015-03-13 NOTE — ED Notes (Signed)
Bed: WA01 Expected date:  Expected time:  Means of arrival:  Comments: Triage 2 

## 2015-03-13 NOTE — Anesthesia Preprocedure Evaluation (Signed)
Anesthesia Evaluation  Patient identified by MRN, date of birth, ID band Patient awake    Reviewed: Allergy & Precautions, H&P , NPO status , Patient's Chart, lab work & pertinent test results  Airway Mallampati: II  TM Distance: >3 FB Neck ROM: full    Dental no notable dental hx.    Pulmonary neg pulmonary ROS,  breath sounds clear to auscultation  Pulmonary exam normal       Cardiovascular Exercise Tolerance: Good negative cardio ROS  Rhythm:regular Rate:Normal  RBBB   Neuro/Psych Internal carotid artery stenosis negative psych ROS   GI/Hepatic negative GI ROS, Neg liver ROS,   Endo/Other  negative endocrine ROS  Renal/GU negative Renal ROS  negative genitourinary   Musculoskeletal   Abdominal   Peds  Hematology negative hematology ROS (+)   Anesthesia Other Findings   Reproductive/Obstetrics negative OB ROS                             Anesthesia Physical Anesthesia Plan  ASA: III  Anesthesia Plan: General   Post-op Pain Management:    Induction: Intravenous  Airway Management Planned: LMA  Additional Equipment:   Intra-op Plan:   Post-operative Plan:   Informed Consent: I have reviewed the patients History and Physical, chart, labs and discussed the procedure including the risks, benefits and alternatives for the proposed anesthesia with the patient or authorized representative who has indicated his/her understanding and acceptance.   Dental Advisory Given  Plan Discussed with: CRNA and Surgeon  Anesthesia Plan Comments:         Anesthesia Quick Evaluation

## 2015-03-13 NOTE — ED Notes (Signed)
MD at bedside. 

## 2015-03-13 NOTE — Op Note (Signed)
See dictation#[id number] Amedeo Plenty MD

## 2015-03-13 NOTE — ED Provider Notes (Signed)
CSN: [id number]     Arrival date & time 03/13/15  1551 History   First MD Initiated Contact with Patient 03/13/15 1614     Chief Complaint  Patient presents with  . Cellulitis     (Consider location/radiation/quality/duration/timing/severity/associated sxs/prior Treatment) HPI Comments: Patient has R middle finger infection. Hx of recurrent infections on this finger, has history of lancing these area just distal to the nail previously and allowing them to drain and heal on their own. Worsening overnight with extension to the palmar side and extending proximally past the DIP.  Patient is a 79 y.o. male presenting with abscess. The history is provided by the patient.  Abscess Location:  Finger Finger abscess location:  L long finger Abscess quality: fluctuance, painful, redness and warmth   Red streaking: yes   Duration:  1 day Progression:  Worsening Pain details:    Quality:  Pressure and sharp   Severity:  Moderate   Duration:  2 days   Timing:  Constant   Progression:  Unchanged Chronicity:  New Context: skin injury   Context: not diabetes, not immunosuppression and not injected drug use   Relieved by:  Nothing Worsened by:  Nothing tried Associated symptoms: no fever, no nausea and no vomiting     Past Medical History  Diagnosis Date  . Hyperlipidemia   . Internal carotid artery stenosis     right on duplex ultrasound   . Right bundle branch block    History reviewed. No pertinent past surgical history. History reviewed. No pertinent family history. History  Substance Use Topics  . Smoking status: Never Smoker   . Smokeless tobacco: Not on file  . Alcohol Use: 0.5 - 1.0 oz/week    1-2 drink(s) per week    Review of Systems  Constitutional: Negative for fever.  Respiratory: Negative for cough and shortness of breath.   Gastrointestinal: Negative for nausea and vomiting.  All other systems reviewed and are negative.     Allergies  Review of patient's  allergies indicates no known allergies.  Home Medications   Prior to Admission medications   Medication Sig Start Date End Date Taking? Authorizing Provider  Ascorbic Acid (VITAMIN C) 500 MG CAPS Take 500 mg by mouth daily.   Yes Historical Provider, MD  aspirin 81 MG tablet Take 81 mg by mouth daily.   Yes Historical Provider, MD  Cinnamon 500 MG TABS Take 500 mg by mouth daily.   Yes Historical Provider, MD  clobetasol (TEMOVATE) 1.93 % GEL 1 application as needed. 02/08/13  Yes Historical Provider, MD  clopidogrel (PLAVIX) 75 MG tablet Take 1 tablet (75 mg total) by mouth daily. 12/01/14  Yes Lorretta Harp, MD  Coenzyme Q10 (COQ10 PO) Take 1 capsule by mouth daily.    Yes Historical Provider, MD  CRESTOR 40 MG tablet Take 20 mg by mouth daily. 02/26/15  Yes Historical Provider, MD  fexofenadine (ALLEGRA) 180 MG tablet Take 180 mg by mouth daily.   Yes Historical Provider, MD  Glucosamine-Chondroitin (GLUCOSAMINE CHONDR COMPLEX PO) Take by mouth. Take 75/100 mg one tablet daily.   Yes Historical Provider, MD  Multiple Vitamin (MULTIVITAMIN) tablet Take 1 tablet by mouth daily.   Yes Historical Provider, MD  Omega-3 Fatty Acids (OMEGA 3 PO) Take 2 g by mouth daily.   Yes Historical Provider, MD  Polyethyl Glycol-Propyl Glycol (SYSTANE OP) Place 1 drop into both eyes 2 (two) times daily as needed (for dry eyes).   Yes Historical Provider, MD  traMADol (ULTRAM) 50 MG tablet Take 50 mg by mouth as needed for moderate pain.  03/01/13  Yes Historical Provider, MD  vitamin E 400 UNIT capsule Take 400 Units by mouth daily.   Yes Historical Provider, MD   BP 135/70 mmHg  Pulse 80  Temp(Src) 98.7 F (37.1 C) (Oral)  Resp 18  SpO2 100% Physical Exam  Constitutional: He is oriented to person, place, and time. He appears well-developed and well-nourished. No distress.  HENT:  Head: Normocephalic and atraumatic.  Mouth/Throat: Oropharynx is clear and moist. No oropharyngeal exudate.  Eyes: EOM are  normal. Pupils are equal, round, and reactive to light.  Neck: Normal range of motion. Neck supple.  Cardiovascular: Normal rate and regular rhythm.  Exam reveals no friction rub.   No murmur heard. Pulmonary/Chest: Effort normal and breath sounds normal. No respiratory distress. He has no wheezes. He has no rales.  Abdominal: Soft. He exhibits no distension. There is no tenderness. There is no rebound.  Musculoskeletal: Normal range of motion. He exhibits no edema.       Hands: Neurological: He is alert and oriented to person, place, and time.  Skin: He is not diaphoretic.  Nursing note and vitals reviewed.   ED Course  Procedures (including critical care time) Labs Review Labs Reviewed  CBC - Abnormal; Notable for the following:    WBC 15.4 (*)    All other components within normal limits  BASIC METABOLIC PANEL - Abnormal; Notable for the following:    GFR calc non Af Amer 79 (*)    All other components within normal limits  SEDIMENTATION RATE  C-REACTIVE PROTEIN    Imaging Review Dg Finger Middle Right  03/13/2015   CLINICAL DATA:  Cellulitis of RIGHT middle finger, infected cyst, redness, pain and swelling  EXAM: RIGHT MIDDLE FINGER 2+V  COMPARISON:  None  FINDINGS: Soft tissue swelling RIGHT middle finger greatest at dorsal aspect at PIP joint.  Osseous mineralization normal.  Joint spaces preserved.  No acute fracture, dislocation or bone destruction.  IMPRESSION: No acute osseous abnormalities.   Electronically Signed   By: Lavonia Dana M.D.   On: 03/13/2015 17:47     EKG Interpretation None      MDM   Final diagnoses:  Septic arthritis of interphalangeal joint of finger, right    78 rolled male here with concerns for finger infection. Has had a cyst disease pop for years but today it is worsened with infection. He was wearing gloves afterwards so he thinks he might of been affected due to that. Denies any fever, vomiting, any systemic symptoms. Vitals are stable  here. On exam his right middle finger has swelling and fluctuance along the entire cuticle, the DIP, and on the palmar side of the finger from the tip of the finger to the middle of the middle phalanx. He does have some pain over the flexor tendon have pain with passive range of motion. I spoke with Dr. Phillip Heal in about possibility of flexor tenosynovitis. He examined patient bedside and will taken to the OR for I and D of a septic DIP of the right middle finger.    Evelina Bucy, MD 03/13/15 [phone]

## 2015-03-13 NOTE — Progress Notes (Signed)
ANTIBIOTIC CONSULT NOTE - INITIAL  Pharmacy Consult for Vancomycin & Zosyn Indication: cellulitis  No Known Allergies  Patient Measurements: Weight: 155 lb (70.308 kg)  Vital Signs: Temp: 98.2 F (36.8 C) (04/22 2015) Temp Source: Oral (04/22 1557) BP: 130/89 mmHg (04/22 2030) Pulse Rate: 86 (04/22 2030) Intake/Output from previous day:   Intake/Output from this shift: Total I/O In: 700 [I.V.:700] Out: -   Labs:  Recent Labs  03/13/15 1700  WBC 15.4*  HGB 15.5  PLT 252  CREATININE 0.96   CrCl cannot be calculated (Unknown ideal weight.). No results for input(s): VANCOTROUGH, VANCOPEAK, VANCORANDOM, GENTTROUGH, GENTPEAK, GENTRANDOM, TOBRATROUGH, TOBRAPEAK, TOBRARND, AMIKACINPEAK, AMIKACINTROU, AMIKACIN in the last 72 hours.   Microbiology: No results found for this or any previous visit (from the past 720 hour(s)).  Medical History: Past Medical History  Diagnosis Date  . Hyperlipidemia   . Internal carotid artery stenosis     right on duplex ultrasound   . Right bundle branch block    Medications:  Scheduled:  . piperacillin-tazobactam (ZOSYN)  IV  3.375 g Intravenous Q8H  . [START ON 03/14/2015] vancomycin  750 mg Intravenous Q12H   Anti-infectives    Start     Dose/Rate Route Frequency Ordered Stop   03/14/15 0600  vancomycin (VANCOCIN) IVPB 750 mg/150 ml premix     750 mg 150 mL/hr over 60 Minutes Intravenous Every 12 hours 03/13/15 2033     03/13/15 2100  piperacillin-tazobactam (ZOSYN) IVPB 3.375 g     3.375 g 12.5 mL/hr over 240 Minutes Intravenous Every 8 hours 03/13/15 2034     03/13/15 1950  polymyxin B 500,000 Units, bacitracin 50,000 Units in sodium chloride irrigation 0.9 % 500 mL irrigation  Status:  Discontinued       As needed 03/13/15 1950 03/13/15 2010     Assessment: 89 yoM with R middle finger cellulitis, lymphadenitis. Numerous lancings of mucous cyst on finger by dermatologist and patient. Now needs I&D and IV antibiotics for  abscess and septic joint.  Received Vancomycin 1gm x1 prior to I&D  Goal of Therapy:  Vancomycin trough level 10-15 mcg/ml  Plan:   Continue Vancomycin 750mg  IV q12  Zosyn 3.375gm q8hr- 4 hr infusion  Follow cultures, renal function, Vanc trough if necessary  Minda Ditto PharmD Pager [phone] 03/13/2015, 8:39 PM

## 2015-03-13 NOTE — H&P (Signed)
Theodore Allen is an 79 y.o. male.   Chief Complaint: Right middle finger infection HPI: Patient presents with long-standing history of a mucous cyst which is subsequently been lanced by dermatology and himself multiple times. I asked the saw him in 2004 or issues of his hand. I have discussed with patient the relevant findings. He is been self decompressing the cyst and has now reached a point where he is caused a infection with DIP sepsis.  He has red streaks erythema cellulitis and other issues which are quite dramatic.  He notes no other pain complaints he denies history of prior infection. He denies neck back chest or abdominal pain. He is alert and oriented and with his wife of many years.  Past Medical History  Diagnosis Date  . Hyperlipidemia   . Internal carotid artery stenosis     right on duplex ultrasound   . Right bundle branch block     History reviewed. No pertinent past surgical history.  History reviewed. No pertinent family history. Social History:  reports that he has never smoked. He does not have any smokeless tobacco history on file. He reports that he drinks about 0.5 - 1.0 oz of alcohol per week. His drug history is not on file.  Allergies: No Known Allergies   (Not in a hospital admission)  Results for orders placed or performed during the hospital encounter of 03/13/15 (from the past 48 hour(s))  CBC     Status: Abnormal   Collection Time: 03/13/15  5:00 PM  Result Value Ref Range   WBC 15.4 (H) 4.0 - 10.5 K/uL   RBC 5.10 4.22 - 5.81 MIL/uL   Hemoglobin 15.5 13.0 - 17.0 g/dL   HCT 47.1 39.0 - 52.0 %   MCV 92.4 78.0 - 100.0 fL   MCH 30.4 26.0 - 34.0 pg   MCHC 32.9 30.0 - 36.0 g/dL   RDW 13.6 11.5 - 15.5 %   Platelets 252 150 - 400 K/uL   No results found.  Review of Systems  Constitutional: Negative.   Eyes: Negative.   Respiratory: Negative.   Gastrointestinal: Negative.   Genitourinary: Negative.   Musculoskeletal:       Infected right middle  finger mucous cyst  Skin: Negative.   Neurological: Negative.   Endo/Heme/Allergies: Negative.   Psychiatric/Behavioral: Negative.     Blood pressure 135/70, pulse 80, temperature 98.7 F (37.1 C), temperature source Oral, resp. rate 18, SpO2 100 %. Physical Exam  Infected right middle finger mucous cyst with ascending cellulitis lymphangitis. The distal interphalangeal joint is quite tender and erythematous as is the middle finger and notable red streaks running up the forearm are present.  PIP and MCP joints appear to be clean. There is no evidence of necrotizing fasciitis. There is no evidence of foreign body. He has obvious nail deformity and devitalized skin tissue where the mucous cyst has been lanced by himself multiple times  The patient is alert and oriented in no acute distress. The patient complains of pain in the affected upper extremity.  The patient is noted to have a normal HEENT exam. Lung fields show equal chest expansion and no shortness of breath. Abdomen exam is nontender without distention. Lower extremity examination does not show any fracture dislocation or blood clot symptoms. Pelvis is stable and the neck and back are stable and nontender. Assessment/Plan Infected right middle finger mucous cyst with DIP joint sepsis  I discussed with the patient the relevant pathophysiology. I discussed him his poor  choice to lanced it himself and subsequently seed it with bacteria. I would hope that we can salvage his finger. I discussed him these issues at length and Dru a picture of the relevant issues pathophysiology and gave him my very  serious concerns in regards to the survivability of his finger  This patient unfortunately has neglected his right middle finger for quite some time and has septic issues in the joint at this juncture. He also has ascending lymphangitis cellulitis and a very aggressive infectious process occurring  I recommended immediate irrigation  debridement in the operative theater, admission to the hospital and aggressive care of the extremity.  We'll plan for irrigation debridement admit IV antibiotics and careful observation. Hopefully we can eradicate the infection. I discussed the patient all issues in hopes to try to salvage his finger.  We are planning surgery for your upper extremity. The risk and benefits of surgery to include risk of bleeding, infection, anesthesia,  damage to normal structures and failure of the surgery to accomplish its intended goals of relieving symptoms and restoring function have been discussed in detail. With this in mind we plan to proceed. I have specifically discussed with the patient the pre-and postoperative regime and the dos and don'ts and risk and benefits in great detail. Risk and benefits of surgery also include risk of dystrophy(CRPS), chronic nerve pain, failure of the healing process to go onto completion and other inherent risks of surgery The relavent the pathophysiology of the disease/injury process, as well as the alternatives for treatment and postoperative course of action has been discussed in great detail with the patient who desires to proceed.  We will do everything in our power to help you (the patient) restore function to the upper extremity. It is a pleasure to see this patient today.  Paulene Floor 03/13/2015, 5:44 PM

## 2015-03-13 NOTE — ED Notes (Signed)
OR staff came and took pt to OR

## 2015-03-14 LAB — C-REACTIVE PROTEIN: CRP: 0.8 mg/dL — ABNORMAL HIGH (ref <0.60)

## 2015-03-14 MED ORDER — VANCOMYCIN HCL IN DEXTROSE 1-5 GM/200ML-% IV SOLN
1000.0000 mg | Freq: Two times a day (BID) | INTRAVENOUS | Status: DC
  Administered 2015-03-14 – 2015-03-16 (×4): 1000 mg via INTRAVENOUS
  Filled 2015-03-14 (×5): qty 200

## 2015-03-14 NOTE — Progress Notes (Signed)
Utilization review complete 

## 2015-03-14 NOTE — Progress Notes (Signed)
Patient ID: Theodore Allen, male   DOB: 1936/10/28, 79 y.o.   MRN: [id number] We have discussed with the patient the issues regarding their infection to the extremity. We will continue antibiotics and await culture results. Often times it will take 3-5 days for cultures to become final. During this time we will typically have the patient on intravenous antibiotics until we can find a parenteral route of antibiotic regime specific for the bacteria or organism isolated. We have discussed with the patient the need for daily irrigation and debridement as well as therapy to the area. We have discussed with the patient the necessity of range of motion to the involved joints as discussed today. We have discussed with the patient the unpredictability of infections at times. We'll continue to work towards good pain control and restoration of function. The patient understands the need for meticulous wound care and the necessity of proper followup.  The possible complications of stiffness (loss of motion), resistant infection, possible deep bone infection, possible chronic pain issues, possible need for multiple surgeries and even amputation.  With this in mind the patient understands our goal is to eradicate the infection to quiesence. We will continue to work towards these goals.  We will plan for repeat I and D tomorrow.  Patient looks quite well today he is nearly pain-free and his clinical exam is improving. We'll plan for repeat I and D and possible closure tomorrow  All questions have been encouraged and answered  Mychelle Kendra M.D.

## 2015-03-14 NOTE — Progress Notes (Signed)
ANTIBIOTIC CONSULT NOTE - FOLLOW UP  Pharmacy Consult for Vancomycin, Zosyn Indication: Joint sepsis  No Known Allergies  Patient Measurements: Height: 5\' 10"  (177.8 cm) Weight: 156 lb 5.2 oz (70.908 kg) IBW/kg (Calculated) : 73  Vital Signs: Temp: 97.9 F (36.6 C) (04/23 1013) Temp Source: Oral (04/23 1013) BP: 129/94 mmHg (04/23 1013) Pulse Rate: 89 (04/23 1013) Intake/Output from previous day: 04/22 0701 - 04/23 0700 In: 2306 [P.O.:480; I.V.:1576; IV Piggyback:250] Out: 600 [Urine:600]  Labs:  Recent Labs  03/13/15 1700  WBC 15.4*  HGB 15.5  PLT 252  CREATININE 0.96   Estimated Creatinine Clearance: 65.6 mL/min (by C-G formula based on Cr of 0.96). No results for input(s): VANCOTROUGH, VANCOPEAK, VANCORANDOM, GENTTROUGH, GENTPEAK, GENTRANDOM, TOBRATROUGH, TOBRAPEAK, TOBRARND, AMIKACINPEAK, AMIKACINTROU, AMIKACIN in the last 72 hours.   Assessment: 32 yoM admitted 4/22 with R middle finger distal interphalangeal joint sepsis with extensor tendon infectious tenosynovitis.  PMH includes long-standing mucous cyst which had been lanced by dermatology and also by the patient himself.  Taken to emergent OR for I&D of septic joint.  Pharmacy is consulted to dose vancomycin and Zosyn.  4/22 >> Vanc  >> 4/22 >> Zosyn  >>    Today, 03/14/2015:  Tmax: afebrile  WBC: 15.4   Renal: SCr 0.96, CrCl ~ 66 ml/min  Cultures pending  Goal of Therapy:  Vancomycin trough level 15-20 mcg/ml  Appropriate abx dosing, eradication of infection.   Plan:   Continue Zosyn 3.375g IV Q8H infused over 4hrs.  Increase to Vancomycin 1g IV q12h.  Measure Vanc trough at steady state.  Follow up renal fxn, culture results, and clinical course.   Gretta Arab PharmD, BCPS Pager [phone] 03/14/2015 2:07 PM

## 2015-03-14 NOTE — Op Note (Signed)
Theodore Allen, Theodore Allen NO.:  [id number]  MEDICAL RECORD NO.:  [id number]  LOCATION:  52                         FACILITY:  [facility]  PHYSICIAN:  Satira Anis. Hezakiah Champeau, M.D.DATE OF BIRTH:  28-May-1936  DATE OF PROCEDURE:  03/13/2015 DATE OF DISCHARGE:                              OPERATIVE REPORT   PREOPERATIVE DIAGNOSIS:  Right middle finger distal interphalangeal joint sepsis with extensor tendon infectious tenosynovitis.  POSTOPERATIVE DIAGNOSIS:  Right middle finger distal interphalangeal joint sepsis with extensor tendon infectious tenosynovitis.  PROCEDURE: 1. I and D deep abscess, right middle finger. 2. Arthrotomy, synovectomy, right middle finger extensive in nature     about the distal interphalangeal joint. 3. Extensive extensor tendon tenolysis, tenosynovectomy secondary to     infectious tenosynovitis.  This was performed throughout the dorsal     aspect of the finger multiple levels. 4. Removal of less than 1.5 cm mass, right middle finger.  SURGEON:  Satira Anis. Amedeo Plenty, M.D.  ASSISTANT:  None.  COMPLICATIONS:  None.  ANESTHESIA:  General.  TOURNIQUET TIME:  Less than an hour.  INDICATIONS:  Mr. Holtsclaw is a 79 year old male with the above-mentioned diagnosis.  I have counseled him in regard to risks and benefits of surgery including risk of infection, bleeding, anesthesia, damage to normal structures, and failure of surgery to accomplish its intended goals of relieving symptoms and restoring function.  With this in mind, he desires to proceed.  He unfortunately has continued to puncture or shave off a mucous cyst and he subsequently developed an infectious distal interphalangeal joint as well as infectious tenosynovitis about the extensor apparatus with his associated mass in question notable (mucous cysts).  I have discussed with the patient all issues, relevant findings, do's and don'ts, etc.  OPERATIVE PROCEDURE:  The patient was seen by  myself and Anesthesia. Taken to the operative theater, underwent general anesthetic.  Was laid supine, appropriately padded, and prepped and draped in usual sterile fashion with Betadine scrub and paint.  Once this was complete, the patient then underwent a de-roofing of the cyst, immediate abscess was noted, this was cultured for aerobic and anaerobic cultures.  Following this, I made a curvilinear modified Brunner incision based radially, skin flap was elevated nicely, the extensor apparatus and the distal interphalangeal joint were able to be accessed in this region.  At this time, I performed removal of a less than 1.5 cm cyst about the middle finger.  This was a mucous cyst in question, it was completely removed including its stalk origin.  Following this, I then performed a distal interphalangeal joint synovectomy and arthrotomy, this was an infected distal interphalangeal joint, quite aggressively infected.  There was no bony lesion per se.  I very aggressively removed all of the synovium and irrigated.  Following this, I then performed an extensive tenolysis, tenosynovectomy of the infectious tenosynovium about the extensor apparatus.  Not only was this removed, but I made an additional incision over the proximal phalanx to ensure that the patient had no advanced worsening crawling up the arm in terms of an infectious etiology.  The patient tolerated this well.  There were no complicating features.  Following this, the patient then underwent a very careful and cautious approach to the finger with irrigation of greater than 3 L of saline. Greater than 3 L of saline through cysto tubing were placed through the wounds, tourniquet was deflated.  Hemostasis secured.  Vessel loop drains were placed and the wound was very loosely tacked with a chromic suture distally.  Dorsally, the patient had a very careful closure.  The finger pinked up nicely.  There were no complicating features.   I did not remove the nail, however, I would expect abnormal nail growth.  I did send tissue cultures given the tremendous tissue abnormality about the extensor apparatus.  I will admit him for IV antibiotics in the form of vancomycin and I should note that 1 g was given in the OR as well as Zosyn.  I would recommend careful close observation, we are going to likely plan for a repeat irrigation and debridement on Sunday pending his wound conditions and findings.  These notes have been discussed.  All questions have been encouraged and answered.  This is the unfortunate sequelae of a mucous cyst infection.     Satira Anis. Amedeo Plenty, M.D.     [facility]  D:  03/13/2015  T:  03/14/2015  Job:  [id number]

## 2015-03-15 ENCOUNTER — Inpatient Hospital Stay (HOSPITAL_COMMUNITY): Payer: Medicare Other | Admitting: Anesthesiology

## 2015-03-15 ENCOUNTER — Encounter (HOSPITAL_COMMUNITY)
Admission: EM | Disposition: A | Discharge status: Home / Self Care | Payer: Self-pay | Source: Home / Self Care | Attending: Orthopedic Surgery

## 2015-03-15 HISTORY — PX: I&D EXTREMITY: SHX5045

## 2015-03-15 LAB — BASIC METABOLIC PANEL
Anion gap: 8 (ref 5–15)
BUN: 22 mg/dL (ref 6–23)
CHLORIDE: 110 mmol/L (ref 96–112)
CO2: 24 mmol/L (ref 19–32)
Calcium: 8.7 mg/dL (ref 8.4–10.5)
Creatinine, Ser: 1.29 mg/dL (ref 0.50–1.35)
GFR, EST AFRICAN AMERICAN: 60 mL/min — AB (ref >90)
GFR, EST NON AFRICAN AMERICAN: 52 mL/min — AB (ref >90)
Glucose, Bld: 109 mg/dL — ABNORMAL HIGH (ref 70–99)
Potassium: 3.7 mmol/L (ref 3.5–5.1)
SODIUM: 142 mmol/L (ref 135–145)

## 2015-03-15 SURGERY — IRRIGATION AND DEBRIDEMENT EXTREMITY
Anesthesia: General | Site: Finger | Laterality: Right

## 2015-03-15 MED ORDER — LACTATED RINGERS IV SOLN: INTRAVENOUS | Status: DC

## 2015-03-15 MED ORDER — SODIUM CHLORIDE 0.9 % IR SOLN
Status: AC
  Filled 2015-03-15: qty 1

## 2015-03-15 MED ORDER — FENTANYL CITRATE (PF) 100 MCG/2ML IJ SOLN
INTRAMUSCULAR | Status: AC
  Filled 2015-03-15: qty 2

## 2015-03-15 MED ORDER — ONDANSETRON HCL 4 MG/2ML IJ SOLN
INTRAMUSCULAR | Status: DC | PRN
  Administered 2015-03-15: 4 mg via INTRAVENOUS

## 2015-03-15 MED ORDER — ONDANSETRON HCL 4 MG/2ML IJ SOLN
INTRAMUSCULAR | Status: AC
  Filled 2015-03-15: qty 2

## 2015-03-15 MED ORDER — FENTANYL CITRATE (PF) 100 MCG/2ML IJ SOLN
25.0000 ug | INTRAMUSCULAR | Status: DC | PRN
  Administered 2015-03-15 (×2): 50 ug via INTRAVENOUS

## 2015-03-15 MED ORDER — BACITRACIN-NEOMYCIN-POLYMYXIN 400-5-5000 EX OINT
TOPICAL_OINTMENT | CUTANEOUS | Status: AC
  Filled 2015-03-15: qty 1

## 2015-03-15 MED ORDER — DEXAMETHASONE SODIUM PHOSPHATE 10 MG/ML IJ SOLN
INTRAMUSCULAR | Status: AC
  Filled 2015-03-15: qty 1

## 2015-03-15 MED ORDER — BACITRACIN-NEOMYCIN-POLYMYXIN 400-5-5000 EX OINT
TOPICAL_OINTMENT | CUTANEOUS | Status: DC | PRN
  Administered 2015-03-15: 1 via TOPICAL

## 2015-03-15 MED ORDER — FENTANYL CITRATE (PF) 250 MCG/5ML IJ SOLN
INTRAMUSCULAR | Status: DC | PRN
  Administered 2015-03-15: 25 ug via INTRAVENOUS
  Administered 2015-03-15: 50 ug via INTRAVENOUS
  Administered 2015-03-15: 25 ug via INTRAVENOUS

## 2015-03-15 MED ORDER — SODIUM CHLORIDE 0.9 % IR SOLN
Status: DC | PRN
  Administered 2015-03-15: 3000 mL

## 2015-03-15 MED ORDER — PROPOFOL 10 MG/ML IV BOLUS
INTRAVENOUS | Status: AC
  Filled 2015-03-15: qty 20

## 2015-03-15 MED ORDER — SODIUM CHLORIDE 0.9 % IR SOLN
Status: DC | PRN
  Administered 2015-03-15: 500 mL

## 2015-03-15 MED ORDER — PROPOFOL 10 MG/ML IV BOLUS
INTRAVENOUS | Status: DC | PRN
  Administered 2015-03-15: 150 mg via INTRAVENOUS
  Administered 2015-03-15: 20 mg via INTRAVENOUS

## 2015-03-15 MED ORDER — DEXAMETHASONE SODIUM PHOSPHATE 10 MG/ML IJ SOLN
INTRAMUSCULAR | Status: DC | PRN
  Administered 2015-03-15: 10 mg via INTRAVENOUS

## 2015-03-15 SURGICAL SUPPLY — 39 items
BAG SPEC THK2 15X12 ZIP CLS (MISCELLANEOUS) ×1
BAG ZIPLOCK 12X15 (MISCELLANEOUS) ×3 IMPLANT
BANDAGE ELASTIC 4 VELCRO ST LF (GAUZE/BANDAGES/DRESSINGS) ×3 IMPLANT
BLADE SURG SZ10 CARB STEEL (BLADE) ×3 IMPLANT
BNDG COHESIVE 3X5 TAN STRL LF (GAUZE/BANDAGES/DRESSINGS) ×2 IMPLANT
BNDG CONFORM 2 STRL LF (GAUZE/BANDAGES/DRESSINGS) ×2 IMPLANT
BNDG GAUZE ELAST 4 BULKY (GAUZE/BANDAGES/DRESSINGS) ×3 IMPLANT
CORDS BIPOLAR (ELECTRODE) ×2 IMPLANT
CUFF TOURN SGL QUICK 18 (TOURNIQUET CUFF) ×3 IMPLANT
DRAIN PENROSE 18X1/2 LTX STRL (DRAIN) ×1 IMPLANT
DRAPE SURG 17X11 SM STRL (DRAPES) ×3 IMPLANT
DRSG EMULSION OIL 3X3 NADH (GAUZE/BANDAGES/DRESSINGS) ×2 IMPLANT
DRSG PAD ABDOMINAL 8X10 ST (GAUZE/BANDAGES/DRESSINGS) ×3 IMPLANT
DRSG XEROFORM 1X8 (GAUZE/BANDAGES/DRESSINGS) ×2 IMPLANT
ELECT REM PT RETURN 9FT ADLT (ELECTROSURGICAL) ×3
ELECTRODE REM PT RTRN 9FT ADLT (ELECTROSURGICAL) ×1 IMPLANT
GAUZE SPONGE 4X4 12PLY STRL (GAUZE/BANDAGES/DRESSINGS) ×3 IMPLANT
GLOVE BIO SURGEON STRL SZ8 (GLOVE) ×1 IMPLANT
GOWN STRL REUS W/TWL XL LVL3 (GOWN DISPOSABLE) ×3 IMPLANT
KIT BASIN OR (CUSTOM PROCEDURE TRAY) ×3 IMPLANT
LOOP VESSEL MAXI BLUE (MISCELLANEOUS) ×2 IMPLANT
MANIFOLD NEPTUNE II (INSTRUMENTS) ×3 IMPLANT
PACK ORTHO EXTREMITY (CUSTOM PROCEDURE TRAY) ×3 IMPLANT
PAD CAST 4YDX4 CTTN HI CHSV (CAST SUPPLIES) ×1 IMPLANT
PADDING CAST COTTON 4X4 STRL (CAST SUPPLIES) ×3
POSITIONER SURGICAL ARM (MISCELLANEOUS) ×3 IMPLANT
SET IRRIG Y TYPE TUR BLADDER L (SET/KITS/TRAYS/PACK) ×2 IMPLANT
SOL PREP POV-IOD 4OZ 10% (MISCELLANEOUS) ×3 IMPLANT
SOL PREP PROV IODINE SCRUB 4OZ (MISCELLANEOUS) ×3 IMPLANT
SPLINT FINGER 3.25 911903 (SOFTGOODS) ×2 IMPLANT
SUT CHROMIC 4 0 PS 2 18 (SUTURE) ×2 IMPLANT
SUT PROLENE 3 0 PS 2 (SUTURE) ×3 IMPLANT
SUT VIC AB 1 CT1 27 (SUTURE) ×3
SUT VIC AB 1 CT1 27XBRD ANTBC (SUTURE) ×1 IMPLANT
SUT VIC AB 2-0 CT1 27 (SUTURE) ×3
SUT VIC AB 2-0 CT1 27XBRD (SUTURE) ×1 IMPLANT
SYR 20CC LL (SYRINGE) ×3 IMPLANT
SYR CONTROL 10ML LL (SYRINGE) ×1 IMPLANT
TOWEL OR 17X26 10 PK STRL BLUE (TOWEL DISPOSABLE) ×3 IMPLANT

## 2015-03-15 NOTE — Op Note (Signed)
NAMEJAQWON, MANFRED NO.:  [id number]  MEDICAL RECORD NO.:  [id number]  LOCATION:  [facility]                         FACILITY:  [facility]  PHYSICIAN:  Satira Anis. Traeton Bordas, M.D.DATE OF BIRTH:  07-31-1936  DATE OF PROCEDURE:  03/15/2015 DATE OF DISCHARGE:                              OPERATIVE REPORT   PREOPERATIVE DIAGNOSIS:  Infected mucous cyst with distal interphalangeal joint  sepsis and extensor tenosynovitis.  POSTOPERATIVE DIAGNOSIS:  Infected mucous cyst with distal interphalangeal joint sepsis and extensor tenosynovitis.  PROCEDURE: 1. Irrigation and debridement of skin, subcutaneous tissue, tendon,     joint, and bone tissue right middle finger. 2. Arthrotomy, synovectomy, right middle finger. 3. Extensive tendon tenosynovectomy.  SURGEON:  Satira Anis. Amedeo Plenty, M.D.  ASSISTANT:  None.  COMPLICATIONS:  None.  ANESTHESIA:  General.  TOURNIQUET TIME:  Zero.  INDICATIONS:  The patient is a 79 year old male, who presents with the above-mentioned diagnosis.  I have counseled him with regard to risks and benefits of surgery including risk of infection, bleeding, anesthesia, damage to normal structures and failure of surgery to accomplish its intended goals of relieving symptoms and restoring function.  With this in mind, he desires to proceed.  All questions have been encouraged and answered preoperatively.  OPERATIVE PROCEDURE:  The patient was seen by myself and Anesthesia, taken to the operative theater and underwent a smooth induction of general anesthesia.  Once this was complete, I then removed the dressing, performed a Hibiclens pre scrub followed by Betadine scrub and paint.  Wound conditions were markedly improved.  He had less erythema and cellulitic change with swelling decreasing.  Certainly, this was not a normal finger but in improved clinical picture overall.  I should note, I did call the lab.  His cultures are pending and nothing has grown  yet.  At this juncture, we then called final time-out and then reopened the wound.  At this time, I performed irrigation and debridement.  The DIP joint underwent a synovectomy arthrotomy and removal of any fibrinous material or de-epithelialized pre-necrotic skin tissue.  The patient had an aggressive exploration of the joint performed.  The bone looked to be in adequate integrity.  The wound was reviewed at length. Following this, I then turned attention towards the extensor apparatus where I performed extensive extensor tendon tenosynovectomy.  [employer] planner as well as Ragnell retractor was used to lift up and down the tendon to make sure it was ample in terms of its excursion and that there is no fraying about the distal insertion of the tendon.  Removal of loose synovium and any pre-necrotic tissue was performed.  Following this, we lavaged with 3 L of saline and following this, antibiotic saline.  The patient tolerated this well.  There were no complicating features.  Once this was complete, I then loosely closed the rotation flap with a 5-0 chromic suture.  The patient had a vessel loop drain placed.  Tourniquet was not insufflated.  There were no complicating features.  All sponge, needle, and instrument counts were reported as correct.  He was dressed with Adaptic, Xeroform, Neosporin gauze, finger splint, and over-wrap.  We will continue  admit status.  We will await cultures.  Should any problems occur, we will be immediately available.  Overall, I do feel he is improving.  However, he is certainly not completely out of the woods in terms of his infection yet.  We will continue the postop algorithm as outlined and defined by myself.  Do's and do not's have been discussed and all questions have been encouraged and answered.     Satira Anis. Amedeo Plenty, M.D.     [facility]  D:  03/15/2015  T:  03/15/2015  Job:  [id number]

## 2015-03-15 NOTE — Anesthesia Postprocedure Evaluation (Signed)
  Anesthesia Post-op Note  Patient: Theodore Allen  Procedure(s) Performed: Procedure(s) (LRB): IRRIGATION AND DEBRIDEMENT RIGHT MIDDLE FINGER (Right)  Patient Location: PACU  Anesthesia Type: General  Level of Consciousness: awake and alert   Airway and Oxygen Therapy: Patient Spontanous Breathing  Post-op Pain: mild  Post-op Assessment: Post-op Vital signs reviewed, Patient's Cardiovascular Status Stable, Respiratory Function Stable, Patent Airway and No signs of Nausea or vomiting  Last Vitals:  Filed Vitals:   03/15/15 0910  BP:   Pulse: 65  Temp:   Resp: 14    Post-op Vital Signs: stable   Complications: No apparent anesthesia complications

## 2015-03-15 NOTE — Op Note (Signed)
See dictation [phone] Improved conditions  Will plan cont IVABX and await culture results Marceline Napierala Md

## 2015-03-15 NOTE — Anesthesia Procedure Notes (Signed)
Procedure Name: LMA Insertion Date/Time: 03/15/2015 7:46 AM Performed by: Dione Booze Pre-anesthesia Checklist: Patient identified, Emergency Drugs available, Suction available and Patient being monitored Patient Re-evaluated:Patient Re-evaluated prior to inductionOxygen Delivery Method: Circle system utilized Preoxygenation: Pre-oxygenation with 100% oxygen Intubation Type: IV induction LMA Size: 4.0 Number of attempts: 1 Placement Confirmation: breath sounds checked- equal and bilateral and positive ETCO2 Tube secured with: Tape Dental Injury: Teeth and Oropharynx as per pre-operative assessment

## 2015-03-15 NOTE — Anesthesia Preprocedure Evaluation (Signed)
Anesthesia Evaluation  Patient identified by MRN, date of birth, ID band Patient awake    Reviewed: Allergy & Precautions, H&P , NPO status , Patient's Chart, lab work & pertinent test results  Airway Mallampati: II  TM Distance: >3 FB Neck ROM: full    Dental no notable dental hx. (+) Dental Advisory Given, Teeth Intact   Pulmonary neg pulmonary ROS,  breath sounds clear to auscultation  Pulmonary exam normal       Cardiovascular Exercise Tolerance: Good negative cardio ROS  Rhythm:regular Rate:Normal  RBBB   Neuro/Psych Internal carotid artery stenosis negative psych ROS   GI/Hepatic negative GI ROS, Neg liver ROS,   Endo/Other  negative endocrine ROS  Renal/GU negative Renal ROS  negative genitourinary   Musculoskeletal   Abdominal   Peds  Hematology negative hematology ROS (+)   Anesthesia Other Findings   Reproductive/Obstetrics negative OB ROS                             Anesthesia Physical Anesthesia Plan  ASA: III  Anesthesia Plan: General   Post-op Pain Management:    Induction: Intravenous  Airway Management Planned: LMA  Additional Equipment:   Intra-op Plan:   Post-operative Plan:   Informed Consent:   Plan Discussed with: Surgeon  Anesthesia Plan Comments:         Anesthesia Quick Evaluation

## 2015-03-15 NOTE — Transfer of Care (Signed)
Immediate Anesthesia Transfer of Care Note  Patient: Theodore Allen  Procedure(s) Performed: Procedure(s): IRRIGATION AND DEBRIDEMENT RIGHT MIDDLE FINGER (Right)  Patient Location: PACU  Anesthesia Type:General  Level of Consciousness: awake, alert , oriented and patient cooperative  Airway & Oxygen Therapy: Patient Spontanous Breathing and Patient connected to face mask oxygen  Post-op Assessment: Report given to RN and Post -op Vital signs reviewed and stable  Post vital signs: Reviewed and stable  Last Vitals:  Filed Vitals:   03/15/15 0643  BP: 112/68  Pulse: 67  Temp: 36.7 C  Resp: 18    Complications: No apparent anesthesia complications

## 2015-03-16 ENCOUNTER — Encounter (HOSPITAL_COMMUNITY): Payer: Self-pay | Admitting: Orthopedic Surgery

## 2015-03-16 LAB — BASIC METABOLIC PANEL
ANION GAP: 7 (ref 5–15)
BUN: 17 mg/dL (ref 6–23)
CHLORIDE: 111 mmol/L (ref 96–112)
CO2: 23 mmol/L (ref 19–32)
Calcium: 8.4 mg/dL (ref 8.4–10.5)
Creatinine, Ser: 1.04 mg/dL (ref 0.50–1.35)
GFR, EST AFRICAN AMERICAN: 78 mL/min — AB (ref >90)
GFR, EST NON AFRICAN AMERICAN: 68 mL/min — AB (ref >90)
Glucose, Bld: 112 mg/dL — ABNORMAL HIGH (ref 70–99)
POTASSIUM: 3.9 mmol/L (ref 3.5–5.1)
SODIUM: 141 mmol/L (ref 135–145)

## 2015-03-16 LAB — VANCOMYCIN, TROUGH: VANCOMYCIN TR: 17.8 ug/mL (ref 10.0–20.0)

## 2015-03-16 LAB — CBC
HCT: 38.7 % — ABNORMAL LOW (ref 39.0–52.0)
HEMOGLOBIN: 12.3 g/dL — AB (ref 13.0–17.0)
MCH: 29.6 pg (ref 26.0–34.0)
MCHC: 31.8 g/dL (ref 30.0–36.0)
MCV: 93 fL (ref 78.0–100.0)
Platelets: 220 10*3/uL (ref 150–400)
RBC: 4.16 MIL/uL — AB (ref 4.22–5.81)
RDW: 14.1 % (ref 11.5–15.5)
WBC: 14.5 10*3/uL — ABNORMAL HIGH (ref 4.0–10.5)

## 2015-03-16 MED ORDER — OXYCODONE HCL 5 MG PO TABS: 5.0000 mg | ORAL_TABLET | ORAL | Status: DC | PRN

## 2015-03-16 MED ORDER — AMOXICILLIN 500 MG PO CAPS: 500.0000 mg | ORAL_CAPSULE | Freq: Three times a day (TID) | ORAL | Status: DC

## 2015-03-16 NOTE — Progress Notes (Signed)
Nurse reviewed discharge instructions with pt.  Pt verbalized understanding of discharge instructions, follow up appointments, and new medications.  No concerns at time of discharge.  Prescriptions given to pt prior to discharge.

## 2015-03-16 NOTE — Progress Notes (Signed)
ANTIBIOTIC CONSULT NOTE - FOLLOW UP  Pharmacy Consult for Vancomycin, Zosyn Indication: Joint sepsis  No Known Allergies  Patient Measurements: Height: 5\' 10"  (177.8 cm) Weight: 156 lb 5.2 oz (70.908 kg) IBW/kg (Calculated) : 73  Vital Signs: Temp: 97.8 F (36.6 C) (04/25 0552) Temp Source: Oral (04/25 0552) BP: 110/68 mmHg (04/25 0552) Pulse Rate: 63 (04/25 0552) Intake/Output from previous day: 04/24 0701 - 04/25 0700 In: 7209 [P.O.:720; I.V.:2000; IV Piggyback:550] Out: 1925 [OBSJG:2836]  Labs:  Recent Labs  03/13/15 1700 03/15/15 0542 03/16/15 0415  WBC 15.4*  --  14.5*  HGB 15.5  --  12.3*  PLT 252  --  220  CREATININE 0.96 1.29 1.04   Estimated Creatinine Clearance: 60.6 mL/min (by C-G formula based on Cr of 1.04).  Recent Labs  03/16/15 0415  VANCOTROUGH 17.8    Assessment: 86 yoM admitted 4/22 with R middle finger distal interphalangeal joint sepsis with extensor tendon infectious tenosynovitis.  PMH includes long-standing mucous cyst which had been lanced by dermatology and also by the patient himself.  I&D of septic joint 4/22 & 4/24, cultures from 4/22 procedure.  Pharmacy consulted to dose vancomycin and Zosyn.  4/22 >> Vanc  >> 4/22 >> Zosyn  >>    Today, 03/16/2015:  Tmax: afebrile  WBC: 15.4 -> 14.5  Renal: SCr elevated 4/24 (1.29), decreasing today (1.04)  Cultures: abscess & tissue cx both Group B Strep  Vancomycin trough: 14.5 mcg/ml  Goal of Therapy:  Vancomycin trough level 15-20 mcg/ml  Appropriate abx dosing, eradication of infection.   Plan:   Continue Zosyn 3.375g IV Q8H infused over 4hrs.  Continue Vancomycin 1g IV q12h.  Continue to monitor renal fxn, culture results, and clinical course.  Thank you for the consult,  Minda Ditto PharmD Pager [phone] 03/16/2015, 7:44 AM

## 2015-03-16 NOTE — Discharge Summary (Signed)
Physician Discharge Summary  Patient ID: Theodore Allen MRN: [id number] DOB/AGE: [birth year] 79 y.o.  Admit date: 03/13/2015 Discharge date: 03/16/2015  Admission Diagnoses: Patient Active Problem List   Diagnosis Date Noted  . Infection of finger 03/13/2015  . Right bundle branch block 05/30/2014  . Carotid artery disease 04/08/2013  . Hyperlipidemia 04/08/2013    Discharge Diagnoses: same improved Active Problems:   Infection of finger   Discharged Condition: Stable   Hospital Course: the patient is a pleasant 79 year old male who presented with a deep infection about the distal phalanx secondary to an ongoing mucous cyst which had become infected over time. He was seen and evaluated emergency room setting following this he was taken to the operative suite where he underwent irrigation and debridement of the distal phalanx. The patient was admitted for observation, IV antibiotics and wound care. He was preemptively placed on Zosyn and vancomycin given the infectious nature of the finger. He did very well postoperatively and had no complicating features. Interoperative cultures did reveal group B Streptococcus Agalactiae. Sensitivities were reviewed. Postoperative day #3 the patient was doing very well, he was having no significant pain or difficulties. The dressings were removed he had no signs of reaccumulating infection, the drain was removed about difficulties. Dressings and finger splint were reapplied. Tolerated this well and there was no complications. We discussed with the patient by mouth antibiotics in the form of amoxicillin.He will resume his Plavix and aspirin. He was will be given oxycodone for pain. He will need to follow up in our office this Friday for a wound check and therapy. Discussed all issues with he and his family.   Significant Diagnostic Studies:  Results for orders placed or performed during the hospital encounter of 03/13/15  Anaerobic culture     Status: None  (Preliminary result)   Collection Time: 03/13/15  3:51 PM  Result Value Ref Range Status   Specimen Description ABSCESS RIGHT MIDDLE FINGER  Final   Special Requests NONE  Final   Gram Stain PENDING  Incomplete   Culture   Final    NO ANAEROBES ISOLATED; CULTURE IN PROGRESS FOR 5 DAYS Performed at [facility]    Report Status PENDING  Incomplete  Tissue culture     Status: None (Preliminary result)   Collection Time: 03/13/15  3:51 PM  Result Value Ref Range Status   Specimen Description FINGER DISTAL INTERPHALANGEAL JOINT  Final   Special Requests NONE  Final   Gram Stain PENDING  Incomplete   Culture   Final    MODERATE GROUP B STREP(S.AGALACTIAE)ISOLATED Note: TESTING AGAINST S. AGALACTIAE NOT ROUTINELY PERFORMED DUE TO PREDICTABILITY OF AMP/PEN/VAN SUSCEPTIBILITY. Performed at [facility]    Report Status PENDING  Incomplete  Culture, routine-abscess     Status: None (Preliminary result)   Collection Time: 03/13/15  7:31 PM  Result Value Ref Range Status   Specimen Description ABSCESS RIGHT MIDDLE FINGER   Final   Special Requests NONE  Final   Gram Stain PENDING  Incomplete   Culture   Final    MODERATE GROUP B STREP(S.AGALACTIAE)ISOLATED Note: TESTING AGAINST S. AGALACTIAE NOT ROUTINELY PERFORMED DUE TO PREDICTABILITY OF AMP/PEN/VAN SUSCEPTIBILITY. Performed at [facility]    Report Status PENDING  Incomplete    Treatments: See operative report for full details   Discharge Exam: Blood pressure 110/63, pulse 83, temperature 97.7 F (36.5 C), temperature source Oral, resp. rate 18, height 5\' 10"  (1.778 m), weight 70.908 kg (156 lb  5.2 oz), SpO2 99 %. Please see hospital course for physical exam   Disposition: Final discharge disposition not confirmed  Discharge Instructions    Call MD / Call 911    Complete by:  As directed   If you experience chest pain or shortness of breath, CALL 911 and be transported to the hospital emergency room.   If you develope a fever above 101 F, pus (white drainage) or increased drainage or redness at the wound, or calf pain, call your surgeon's office.     Constipation Prevention    Complete by:  As directed   Drink plenty of fluids.  Prune juice may be helpful.  You may use a stool softener, such as Colace (over the counter) 100 mg twice a day.  Use MiraLax (over the counter) for constipation as needed.     Diet - low sodium heart healthy    Complete by:  As directed      Discharge instructions    Complete by:  As directed   .Marland KitchenWe recommend that you to take vitamin C 1000 mg a day to promote healing. We also recommend that if you require  pain medicine that you take a stool softener to prevent constipation as most pain medicines will have constipation side effects. We recommend either Peri-Colace or Senokot and recommend that you also consider adding MiraLAX to prevent the constipation affects from pain medicine if you are required to use them. These medicines are over the counter and maybe purchased at a local pharmacy. A cup of yogurt and a probiotic can also be helpful during the recovery process as the medicines can disrupt your intestinal environment.  Marland KitchenKeep bandage clean and dry.  Call for any problems.  No smoking.  Criteria for driving a car: you should be off your pain medicine for 7-8 hours, able to drive one handed(confident), thinking clearly and feeling able in your judgement to drive. Continue elevation as it will decrease swelling.  If instructed by MD move your fingers within the confines of the bandage/splint.  Use ice if instructed by your MD. Call immediately for any sudden loss of feeling in your hand/arm or change in functional abilities of the extremity.     Increase activity slowly as tolerated    Complete by:  As directed             Medication List    TAKE these medications        amoxicillin 500 MG capsule  Commonly known as:  AMOXIL  Take 1 capsule (500 mg total) by  mouth 3 (three) times daily.     aspirin 81 MG tablet  Take 81 mg by mouth daily.     Cinnamon 500 MG Tabs  Take 500 mg by mouth daily.     clobetasol 0.05 % Gel  Commonly known as:  TEMOVATE  1 application as needed.     clopidogrel 75 MG tablet  Commonly known as:  PLAVIX  Take 1 tablet (75 mg total) by mouth daily.     COQ10 PO  Take 1 capsule by mouth daily.     CRESTOR 40 MG tablet  Generic drug:  rosuvastatin  Take 20 mg by mouth daily.     fexofenadine 180 MG tablet  Commonly known as:  ALLEGRA  Take 180 mg by mouth daily.     GLUCOSAMINE CHONDR COMPLEX PO  Take by mouth. Take 75/100 mg one tablet daily.     multivitamin tablet  Take 1  tablet by mouth daily.     OMEGA 3 PO  Take 2 g by mouth daily.     oxyCODONE 5 MG immediate release tablet  Commonly known as:  Oxy IR/ROXICODONE  Take 1-2 tablets (5-10 mg total) by mouth every 3 (three) hours as needed for moderate pain.     SYSTANE OP  Place 1 drop into both eyes 2 (two) times daily as needed (for dry eyes).     traMADol 50 MG tablet  Commonly known as:  ULTRAM  Take 50 mg by mouth as needed for moderate pain.     Vitamin C 500 MG Caps  Take 500 mg by mouth daily.     vitamin E 400 UNIT capsule  Take 400 Units by mouth daily.           Follow-up Information    Follow up with Paulene Floor, MD On 03/20/2015.   Specialty:  Orthopedic Surgery   Why:  For wound re-check, call for questions or concerns   Contact information:   [address] [place] [address] [phone]       Signed: Ivan Croft 03/16/2015, 10:05 AM

## 2015-03-16 NOTE — Discharge Instructions (Signed)
.  Keep bandage clean and dry.  Call for any problems.  No smoking.  Criteria for driving a car: you should be off your pain medicine for 7-8 hours, able to drive one handed(confident), thinking clearly and feeling able in your judgement to drive. Continue elevation as it will decrease swelling.  If instructed by MD move your fingers within the confines of the bandage/splint.  Use ice if instructed by your MD. Call immediately for any sudden loss of feeling in your hand/arm or change in functional abilities of the extremity. .We recommend that you to take vitamin C 1000 mg a day to promote healing. We also recommend that if you require  pain medicine that you take a stool softener to prevent constipation as most pain medicines will have constipation side effects. We recommend either Peri-Colace or Senokot and recommend that you also consider adding MiraLAX to prevent the constipation affects from pain medicine if you are required to use them. These medicines are over the counter and maybe purchased at a local pharmacy. A cup of yogurt and a probiotic can also be helpful during the recovery process as the medicines can disrupt your intestinal environment.

## 2015-03-17 LAB — TISSUE CULTURE: Gram Stain: NONE SEEN

## 2015-03-17 LAB — CULTURE, ROUTINE-ABSCESS: GRAM STAIN: NONE SEEN

## 2015-03-18 LAB — ANAEROBIC CULTURE: Gram Stain: NONE SEEN

## 2015-03-20 DIAGNOSIS — L089 Local infection of the skin and subcutaneous tissue, unspecified: Secondary | ICD-10-CM | POA: Diagnosis not present

## 2015-03-27 DIAGNOSIS — Z4789 Encounter for other orthopedic aftercare: Secondary | ICD-10-CM | POA: Diagnosis not present

## 2015-03-27 DIAGNOSIS — M79644 Pain in right finger(s): Secondary | ICD-10-CM | POA: Diagnosis not present

## 2015-04-03 DIAGNOSIS — Z4789 Encounter for other orthopedic aftercare: Secondary | ICD-10-CM | POA: Diagnosis not present

## 2015-04-03 DIAGNOSIS — L089 Local infection of the skin and subcutaneous tissue, unspecified: Secondary | ICD-10-CM | POA: Diagnosis not present

## 2015-04-22 DIAGNOSIS — Z4789 Encounter for other orthopedic aftercare: Secondary | ICD-10-CM | POA: Diagnosis not present

## 2015-05-18 ENCOUNTER — Other Ambulatory Visit: Payer: Self-pay

## 2015-06-03 ENCOUNTER — Ambulatory Visit (INDEPENDENT_AMBULATORY_CARE_PROVIDER_SITE_OTHER): Payer: Medicare Other | Admitting: Cardiovascular Disease

## 2015-06-03 ENCOUNTER — Encounter: Payer: Self-pay | Admitting: Cardiovascular Disease

## 2015-06-03 VITALS — BP 122/70 | HR 66 | Ht 67.0 in | Wt 150.0 lb

## 2015-06-03 DIAGNOSIS — I779 Disorder of arteries and arterioles, unspecified: Secondary | ICD-10-CM

## 2015-06-03 DIAGNOSIS — I4510 Unspecified right bundle-branch block: Secondary | ICD-10-CM | POA: Diagnosis not present

## 2015-06-03 DIAGNOSIS — E785 Hyperlipidemia, unspecified: Secondary | ICD-10-CM

## 2015-06-03 DIAGNOSIS — I6521 Occlusion and stenosis of right carotid artery: Secondary | ICD-10-CM

## 2015-06-03 DIAGNOSIS — I739 Peripheral vascular disease, unspecified: Secondary | ICD-10-CM

## 2015-06-03 NOTE — Assessment & Plan Note (Signed)
History of moderate right internal carotid artery stenosis by duplex ultrasound last checked 03/02/15. This had remained stable compared to the prior study a year before. He is neurologically asymptomatic on aspirin and Plavix. We will continue to follow him on an annual basis

## 2015-06-03 NOTE — Progress Notes (Signed)
06/03/2015 Theodore Allen   12-Jan-1936  [id number]  Primary Physician Theodore Redwood, MD Primary Cardiologist: Theodore Harp MD Theodore Allen   HPI:  The patient is a delightful 79 year old fit-appearing married Caucasian male, father of 24, grandfather to 5 grandchildren, whom I saw a year ago. We have been following an asymptomatic right internal carotid artery stenosis by duplex ultrasound on an annual basis. This has remained remarkably stable and he is neurologically asymptomatic. His other problems include hyperlipidemia. He denies chest pain or shortness of breath. His last Myoview performed 11/04/08 was normal.  He also has a chronic right bundle-branch block.   Current Outpatient Prescriptions  Medication Sig Dispense Refill  . Ascorbic Acid (VITAMIN C) 500 MG CAPS Take 500 mg by mouth daily.    Marland Kitchen aspirin 81 MG tablet Take 81 mg by mouth daily.    . Cinnamon 500 MG TABS Take 500 mg by mouth daily.    . clopidogrel (PLAVIX) 75 MG tablet Take 1 tablet (75 mg total) by mouth daily. 90 tablet 1  . Coenzyme Q10 (COQ10 PO) Take 1 capsule by mouth daily.     . CRESTOR 40 MG tablet Take 20 mg by mouth daily.    . fexofenadine (ALLEGRA) 180 MG tablet Take 180 mg by mouth daily.    . Glucosamine-Chondroitin (GLUCOSAMINE CHONDR COMPLEX PO) Take by mouth. Take 75/100 mg one tablet daily.    . Multiple Vitamin (MULTIVITAMIN) tablet Take 1 tablet by mouth daily.    . Omega-3 Fatty Acids (OMEGA 3 PO) Take 2 g by mouth daily.    Theodore Allen Glycol-Propyl Glycol (SYSTANE OP) Place 1 drop into both eyes 2 (two) times daily as needed (for dry eyes).    . vitamin E 400 UNIT capsule Take 400 Units by mouth daily.     No current facility-administered medications for this visit.    No Known Allergies  History   Social History  . Marital Status: Married    Spouse Name: N/A  . Number of Children: N/A  . Years of Education: N/A   Occupational History  . Not on file.   Social  History Main Topics  . Smoking status: Never Smoker   . Smokeless tobacco: Not on file  . Alcohol Use: 0.5 - 1.0 oz/week    1-2 drink(s) per week  . Drug Use: Not on file  . Sexual Activity: Not on file   Other Topics Concern  . Not on file   Social History Narrative     Review of Systems: General: negative for chills, fever, night sweats or weight changes.  Cardiovascular: negative for chest pain, dyspnea on exertion, edema, orthopnea, palpitations, paroxysmal nocturnal dyspnea or shortness of breath Dermatological: negative for rash Respiratory: negative for cough or wheezing Urologic: negative for hematuria Abdominal: negative for nausea, vomiting, diarrhea, bright red blood per rectum, melena, or hematemesis Neurologic: negative for visual changes, syncope, or dizziness All other systems reviewed and are otherwise negative except as noted above.    Blood pressure 122/70, pulse 66, height 5\' 7"  (1.702 m), weight 150 lb (68.04 kg).  General appearance: alert and no distress Neck: no adenopathy, no JVD, supple, symmetrical, trachea midline, thyroid not enlarged, symmetric, no tenderness/mass/nodules and soft right carotid bruit Lungs: clear to auscultation bilaterally Heart: regular rate and rhythm, S1, S2 normal, no murmur, click, rub or gallop Extremities: extremities normal, atraumatic, no cyanosis or edema  EKG sinus rhythm at 66 with right bundle branch block. I  personally reviewed this EKG  ASSESSMENT AND PLAN:   Right bundle branch block chronic  Hyperlipidemia History of hyperlipidemia on Crestor followed by his PCP  Carotid artery disease History of moderate right internal carotid artery stenosis by duplex ultrasound last checked 03/02/15. This had remained stable compared to the prior study a year before. He is neurologically asymptomatic on aspirin and Plavix. We will continue to follow him on an annual basis      Theodore Harp MD [facility],  [facility] 06/03/2015 12:02 PM

## 2015-06-03 NOTE — Patient Instructions (Signed)
Dr Berry recommends that you schedule a follow-up appointment in 1 year. You will receive a reminder letter in the mail two months in advance. If you don't receive a letter, please call our office to schedule the follow-up appointment. 

## 2015-06-03 NOTE — Assessment & Plan Note (Signed)
chronic

## 2015-06-03 NOTE — Assessment & Plan Note (Signed)
History of hyperlipidemia on Crestor followed by his PCP 

## 2015-06-04 ENCOUNTER — Encounter: Payer: Self-pay | Admitting: Cardiovascular Disease

## 2015-06-16 ENCOUNTER — Other Ambulatory Visit: Payer: Self-pay | Admitting: Cardiovascular Disease

## 2015-09-02 DIAGNOSIS — L820 Inflamed seborrheic keratosis: Secondary | ICD-10-CM | POA: Diagnosis not present

## 2015-09-02 DIAGNOSIS — L570 Actinic keratosis: Secondary | ICD-10-CM | POA: Diagnosis not present

## 2015-09-02 DIAGNOSIS — D044 Carcinoma in situ of skin of scalp and neck: Secondary | ICD-10-CM | POA: Diagnosis not present

## 2015-09-03 DIAGNOSIS — H47321 Drusen of optic disc, right eye: Secondary | ICD-10-CM | POA: Diagnosis not present

## 2015-09-03 DIAGNOSIS — Z961 Presence of intraocular lens: Secondary | ICD-10-CM | POA: Diagnosis not present

## 2015-09-19 DIAGNOSIS — Z23 Encounter for immunization: Secondary | ICD-10-CM | POA: Diagnosis not present

## 2015-10-12 DIAGNOSIS — H47321 Drusen of optic disc, right eye: Secondary | ICD-10-CM | POA: Diagnosis not present

## 2015-12-23 ENCOUNTER — Other Ambulatory Visit: Payer: Self-pay | Admitting: *Deleted

## 2015-12-24 MED ORDER — CLOPIDOGREL BISULFATE 75 MG PO TABS: 75.0000 mg | ORAL_TABLET | Freq: Every day | ORAL | Status: DC

## 2015-12-24 NOTE — Telephone Encounter (Signed)
Rx request sent to pharmacy.  

## 2016-03-01 DIAGNOSIS — R7301 Impaired fasting glucose: Secondary | ICD-10-CM | POA: Diagnosis not present

## 2016-03-01 DIAGNOSIS — Z0000 Encounter for general adult medical examination without abnormal findings: Secondary | ICD-10-CM | POA: Diagnosis not present

## 2016-03-01 DIAGNOSIS — E784 Other hyperlipidemia: Secondary | ICD-10-CM | POA: Diagnosis not present

## 2016-03-02 DIAGNOSIS — Z08 Encounter for follow-up examination after completed treatment for malignant neoplasm: Secondary | ICD-10-CM | POA: Diagnosis not present

## 2016-03-02 DIAGNOSIS — L570 Actinic keratosis: Secondary | ICD-10-CM | POA: Diagnosis not present

## 2016-03-02 DIAGNOSIS — Z85828 Personal history of other malignant neoplasm of skin: Secondary | ICD-10-CM | POA: Diagnosis not present

## 2016-03-08 DIAGNOSIS — J3089 Other allergic rhinitis: Secondary | ICD-10-CM | POA: Diagnosis not present

## 2016-03-08 DIAGNOSIS — N529 Male erectile dysfunction, unspecified: Secondary | ICD-10-CM | POA: Diagnosis not present

## 2016-03-08 DIAGNOSIS — Z6824 Body mass index (BMI) 24.0-24.9, adult: Secondary | ICD-10-CM | POA: Diagnosis not present

## 2016-03-08 DIAGNOSIS — Z0000 Encounter for general adult medical examination without abnormal findings: Secondary | ICD-10-CM | POA: Diagnosis not present

## 2016-03-08 DIAGNOSIS — R7301 Impaired fasting glucose: Secondary | ICD-10-CM | POA: Diagnosis not present

## 2016-03-08 DIAGNOSIS — E784 Other hyperlipidemia: Secondary | ICD-10-CM | POA: Diagnosis not present

## 2016-03-08 DIAGNOSIS — Z1389 Encounter for screening for other disorder: Secondary | ICD-10-CM | POA: Diagnosis not present

## 2016-03-08 DIAGNOSIS — I6529 Occlusion and stenosis of unspecified carotid artery: Secondary | ICD-10-CM | POA: Diagnosis not present

## 2016-03-28 ENCOUNTER — Other Ambulatory Visit: Payer: Self-pay | Admitting: Cardiovascular Disease

## 2016-03-28 DIAGNOSIS — I6523 Occlusion and stenosis of bilateral carotid arteries: Secondary | ICD-10-CM

## 2016-04-04 ENCOUNTER — Ambulatory Visit (HOSPITAL_COMMUNITY)
Admission: RE | Admit: 2016-04-04 | Discharge: 2016-04-04 | Disposition: A | Discharge status: Home / Self Care | Payer: Medicare Other | Source: Ambulatory Visit | Attending: Cardiology | Admitting: Cardiology

## 2016-04-04 DIAGNOSIS — I6523 Occlusion and stenosis of bilateral carotid arteries: Secondary | ICD-10-CM

## 2016-04-04 DIAGNOSIS — I4510 Unspecified right bundle-branch block: Secondary | ICD-10-CM | POA: Insufficient documentation

## 2016-04-04 DIAGNOSIS — E785 Hyperlipidemia, unspecified: Secondary | ICD-10-CM | POA: Diagnosis not present

## 2016-04-06 ENCOUNTER — Telehealth: Payer: Self-pay

## 2016-04-06 DIAGNOSIS — I779 Disorder of arteries and arterioles, unspecified: Secondary | ICD-10-CM

## 2016-04-06 DIAGNOSIS — I739 Peripheral vascular disease, unspecified: Principal | ICD-10-CM

## 2016-04-06 NOTE — Telephone Encounter (Signed)
-----   Message from Lorretta Harp, MD sent at 04/05/2016  5:56 AM EDT ----- No change from prior study. Repeat in 12 months.

## 2016-04-17 ENCOUNTER — Encounter: Payer: Self-pay | Admitting: Cardiovascular Disease

## 2016-05-11 ENCOUNTER — Telehealth: Payer: Self-pay | Admitting: *Deleted

## 2016-05-11 NOTE — Telephone Encounter (Signed)
Requesting surgical clearance: PATIENT HAS OFFICE VISIT SCHEDULE WITH DR BERRY ON 06/17/16.  1. Type of surgery: COLONOSCOPY  2. Surgeon: DR Jenny Reichmann HAYES  3. Surgical date: 06/23/2016  4. Medications that need to be held: PLAVIX AND ASA  5. CAD: No     6. I will defer to: DR [place] [phone]- PHONE

## 2016-05-12 NOTE — Telephone Encounter (Signed)
Clarified with Dr Gwenlyn Found that pt is cleared for low-cardiovascular risk and may hold his Plavix and ASA for 7 days prior to the procedure.  Message routed to [facility] at fax number [phone] via EPIC.

## 2016-05-12 NOTE — Telephone Encounter (Signed)
The patient had interrupted his antiplatelet therapy for colonoscopy

## 2016-05-14 ENCOUNTER — Other Ambulatory Visit: Payer: Self-pay | Admitting: Cardiovascular Disease

## 2016-05-16 NOTE — Telephone Encounter (Signed)
Rx request sent to pharmacy.  

## 2016-06-17 ENCOUNTER — Encounter: Payer: Self-pay | Admitting: Cardiovascular Disease

## 2016-06-17 ENCOUNTER — Ambulatory Visit (INDEPENDENT_AMBULATORY_CARE_PROVIDER_SITE_OTHER): Payer: Medicare Other | Admitting: Cardiovascular Disease

## 2016-06-17 VITALS — BP 159/83 | HR 67 | Ht 66.0 in | Wt 153.6 lb

## 2016-06-17 DIAGNOSIS — I4510 Unspecified right bundle-branch block: Secondary | ICD-10-CM | POA: Diagnosis not present

## 2016-06-17 DIAGNOSIS — E785 Hyperlipidemia, unspecified: Secondary | ICD-10-CM | POA: Diagnosis not present

## 2016-06-17 DIAGNOSIS — I6523 Occlusion and stenosis of bilateral carotid arteries: Secondary | ICD-10-CM | POA: Diagnosis not present

## 2016-06-17 DIAGNOSIS — I739 Peripheral vascular disease, unspecified: Secondary | ICD-10-CM

## 2016-06-17 DIAGNOSIS — I779 Disorder of arteries and arterioles, unspecified: Secondary | ICD-10-CM

## 2016-06-17 NOTE — Assessment & Plan Note (Signed)
History of asymptomatic moderate right ICA stenosis followed by duplex ultrasound. His most recent ultrasound performed 04/04/16 showed moderate stable right ICA stenosis. This will be repeated and it another 6 months. He is on aspirin and Plavix.

## 2016-06-17 NOTE — Patient Instructions (Signed)
Medication Instructions:  Your physician recommends that you continue on your current medications as directed. Please refer to the Current Medication list given to you today.   Labwork: Labwork will be requested from your primary care physician.   Testing/Procedures: Your physician has requested that you have a carotid duplex. This test is an ultrasound of the carotid arteries in your neck. It looks at blood flow through these arteries that supply the brain with blood. Allow one hour for this exam. There are no restrictions or special instructions.   IN November 2017, ORDER ALREADY IN EPIC.    Follow-Up: Your physician wants you to follow-up in: [place] will receive a reminder letter in the mail two months in advance. If you don't receive a letter, please call our office to schedule the follow-up appointment.   If you need a refill on your cardiac medications before your next appointment, please call your pharmacy.

## 2016-06-17 NOTE — Assessment & Plan Note (Signed)
History of hyperlipidemia on statin therapy followed by his PCP 

## 2016-06-17 NOTE — Progress Notes (Signed)
06/17/2016 Theodore Allen   07-04-1936  [id number]  Primary Physician Theodore Redwood, MD Primary Cardiologist: Theodore Harp MD Renae Gloss  HPI:  The patient is a delightful 80 year old fit-appearing married Caucasian male, father of 17, grandfather to 5 grandchildren, whom I saw 06/03/15. We have been following an asymptomatic right internal carotid artery stenosis by duplex ultrasound on an annual basis. This has remained remarkably stable and he is neurologically asymptomatic. His other problems include hyperlipidemia. He denies chest pain or shortness of breath. His last Myoview performed 11/04/08 was normal.  He also has a chronic right bundle-branch block.   Current Outpatient Prescriptions  Medication Sig Dispense Refill  . Ascorbic Acid (VITAMIN C) 500 MG CAPS Take 500 mg by mouth 2 (two) times daily.     Marland Kitchen aspirin 81 MG tablet Take 81 mg by mouth daily.    . calcium-vitamin D (OSCAL WITH D) 500-200 MG-UNIT tablet Take 1 tablet by mouth.    . Cinnamon 500 MG TABS Take 500 mg by mouth daily.    . clopidogrel (PLAVIX) 75 MG tablet TAKE 1 TABLET (75 MG TOTAL) BY MOUTH DAILY. 90 tablet 1  . Coenzyme Q10 (COQ10 PO) Take 1 capsule by mouth daily.     . CRESTOR 40 MG tablet Take 20 mg by mouth daily.    . fexofenadine (ALLEGRA) 180 MG tablet Take 180 mg by mouth daily.    . Glucosamine-Chondroitin (GLUCOSAMINE CHONDR COMPLEX PO) Take by mouth. Take 75/100 mg one tablet daily.    Marland Kitchen GRAPE SEED EXTRACT PO Take by mouth.    . Multiple Vitamin (MULTIVITAMIN) tablet Take 1 tablet by mouth daily.    . Omega-3 Fatty Acids (OMEGA 3 PO) Take 2 g by mouth daily.    [name] Glycol-Propyl Glycol (SYSTANE OP) Place 1 drop into both eyes 2 (two) times daily as needed (for dry eyes).    . Probiotic Product (PROBIOTIC DAILY PO) Take by mouth.    . TURMERIC PO Take by mouth.    . vitamin E 400 UNIT capsule Take 400 Units by mouth daily.     No current facility-administered medications  for this visit.     No Known Allergies  Social History   Social History  . Marital status: Married    Spouse name: N/A  . Number of children: N/A  . Years of education: N/A   Occupational History  . Not on file.   Social History Main Topics  . Smoking status: Never Smoker  . Smokeless tobacco: Not on file  . Alcohol use 0.5 - 1.0 oz/week    1 - 2 drink(s) per week  . Drug use: Unknown  . Sexual activity: Not on file   Other Topics Concern  . Not on file   Social History Narrative  . No narrative on file     Review of Systems: General: negative for chills, fever, night sweats or weight changes.  Cardiovascular: negative for chest pain, dyspnea on exertion, edema, orthopnea, palpitations, paroxysmal nocturnal dyspnea or shortness of breath Dermatological: negative for rash Respiratory: negative for cough or wheezing Urologic: negative for hematuria Abdominal: negative for nausea, vomiting, diarrhea, bright red blood per rectum, melena, or hematemesis Neurologic: negative for visual changes, syncope, or dizziness All other systems reviewed and are otherwise negative except as noted above.    Blood pressure (!) 159/83, pulse 67, height 5\' 6"  (1.676 m), weight 153 lb 9.6 oz (69.7 kg).  General appearance: alert  and no distress Neck: no adenopathy, no JVD, supple, symmetrical, trachea midline, thyroid not enlarged, symmetric, no tenderness/mass/nodules and Soft right carotid bruit Lungs: clear to auscultation bilaterally Heart: regular rate and rhythm, S1, S2 normal, no murmur, click, rub or gallop Extremities: extremities normal, atraumatic, no cyanosis or edema  EKG normal sinus rhythm at 67 with right bundle branch block. I personally reviewed this EKG  ASSESSMENT AND PLAN:   Carotid artery disease History of asymptomatic moderate right ICA stenosis followed by duplex ultrasound. His most recent ultrasound performed 04/04/16 showed moderate stable right ICA  stenosis. This will be repeated and it another 6 months. He is on aspirin and Plavix.  Hyperlipidemia History of hyperlipidemia on statin therapy followed by his PCP      Theodore Harp MD [facility], [facility] 06/17/2016 1:49 PM

## 2016-06-23 DIAGNOSIS — Z86010 Personal history of colonic polyps: Secondary | ICD-10-CM | POA: Diagnosis not present

## 2016-06-23 DIAGNOSIS — K5730 Diverticulosis of large intestine without perforation or abscess without bleeding: Secondary | ICD-10-CM | POA: Diagnosis not present

## 2016-08-31 DIAGNOSIS — L570 Actinic keratosis: Secondary | ICD-10-CM | POA: Diagnosis not present

## 2016-08-31 DIAGNOSIS — Z08 Encounter for follow-up examination after completed treatment for malignant neoplasm: Secondary | ICD-10-CM | POA: Diagnosis not present

## 2016-08-31 DIAGNOSIS — Z85828 Personal history of other malignant neoplasm of skin: Secondary | ICD-10-CM | POA: Diagnosis not present

## 2016-09-28 DIAGNOSIS — Z23 Encounter for immunization: Secondary | ICD-10-CM | POA: Diagnosis not present

## 2016-10-17 DIAGNOSIS — H47321 Drusen of optic disc, right eye: Secondary | ICD-10-CM | POA: Diagnosis not present

## 2017-01-05 ENCOUNTER — Other Ambulatory Visit: Payer: Self-pay | Admitting: Cardiovascular Disease

## 2017-01-05 NOTE — Telephone Encounter (Signed)
Rx(s) sent to pharmacy electronically.  

## 2017-03-07 IMAGING — CR DG FINGER MIDDLE 2+V*R*
3 series · 3 of 3 positions shown · non-contrast
Comparison: None

CLINICAL DATA: Cellulitis of RIGHT middle finger, infected cyst,
redness, pain and swelling

EXAM:
RIGHT MIDDLE FINGER 2+V

[x finger pa right]
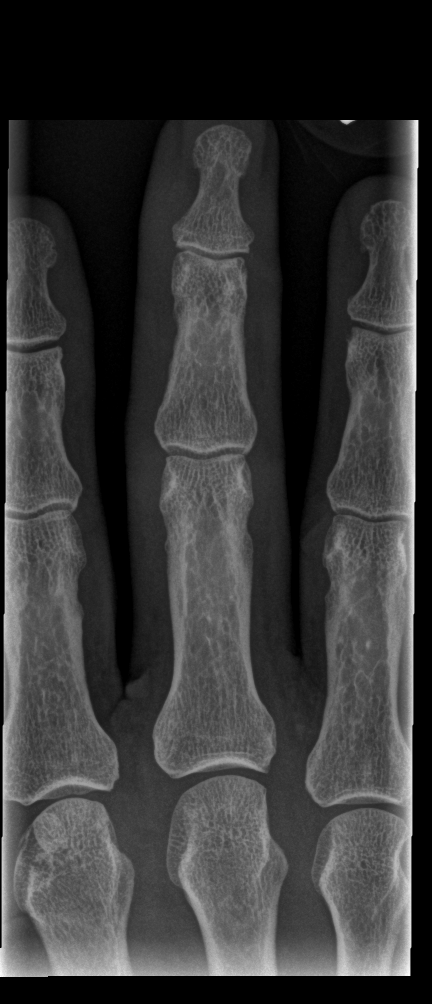

[x finger obl right]
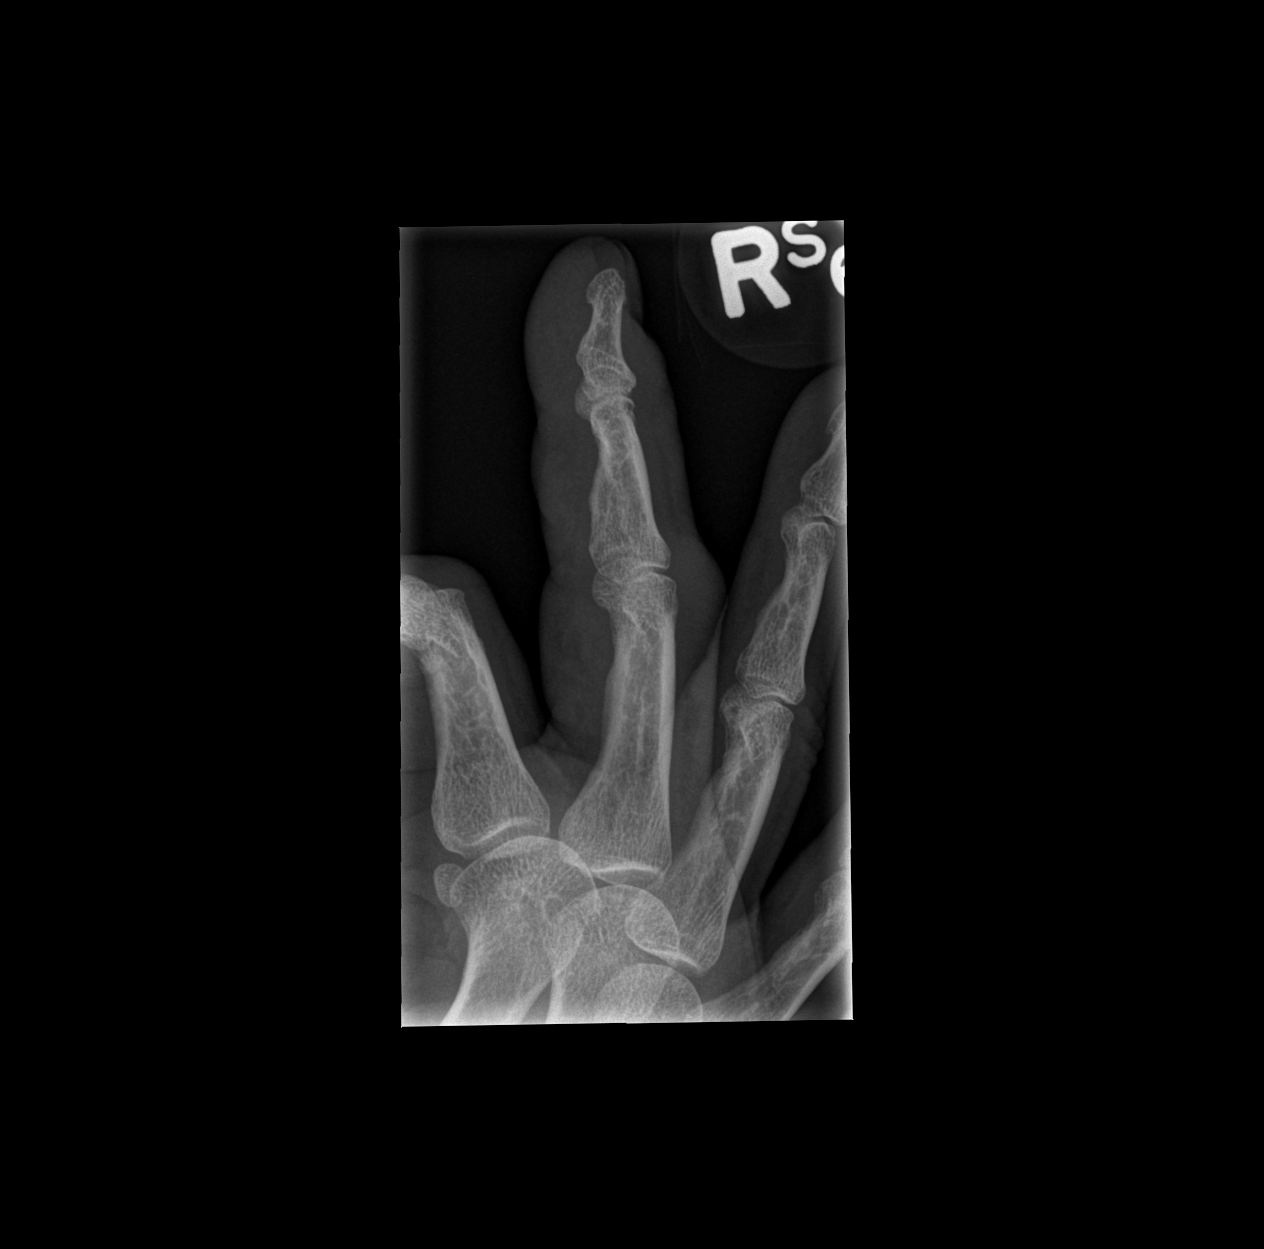

[x finger lat right]
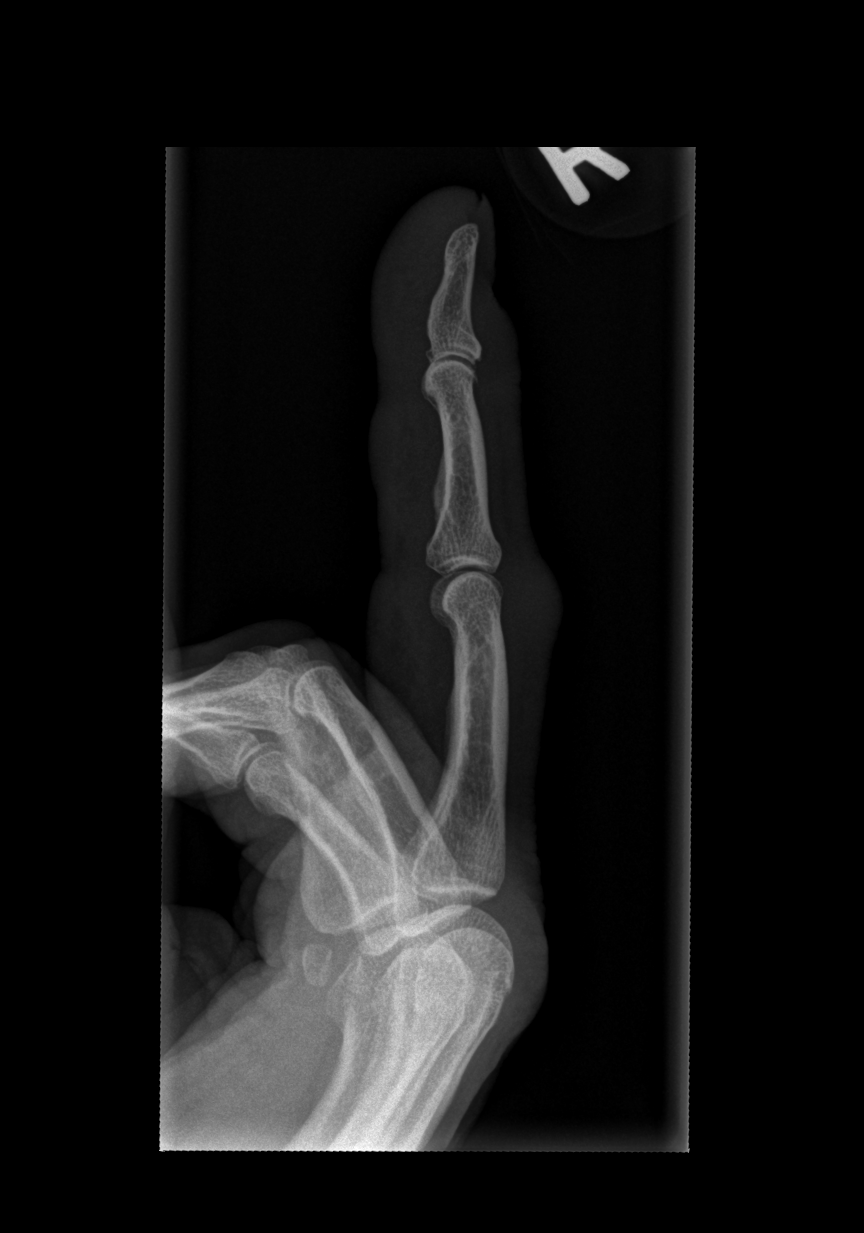

[3 of 3 positions shown; findings below may reference images not displayed]

FINDINGS: Soft tissue swelling RIGHT middle finger greatest at dorsal aspect
at PIP joint.

Osseous mineralization normal.

Joint spaces preserved.

No acute fracture, dislocation or bone destruction.
IMPRESSION: No acute osseous abnormalities.

## 2017-03-08 DIAGNOSIS — L570 Actinic keratosis: Secondary | ICD-10-CM | POA: Diagnosis not present

## 2017-03-08 DIAGNOSIS — Z85828 Personal history of other malignant neoplasm of skin: Secondary | ICD-10-CM | POA: Diagnosis not present

## 2017-03-08 DIAGNOSIS — Z08 Encounter for follow-up examination after completed treatment for malignant neoplasm: Secondary | ICD-10-CM | POA: Diagnosis not present

## 2017-03-10 DIAGNOSIS — E784 Other hyperlipidemia: Secondary | ICD-10-CM | POA: Diagnosis not present

## 2017-03-10 DIAGNOSIS — R7301 Impaired fasting glucose: Secondary | ICD-10-CM | POA: Diagnosis not present

## 2017-03-17 DIAGNOSIS — Z0000 Encounter for general adult medical examination without abnormal findings: Secondary | ICD-10-CM | POA: Diagnosis not present

## 2017-03-17 DIAGNOSIS — I6529 Occlusion and stenosis of unspecified carotid artery: Secondary | ICD-10-CM | POA: Diagnosis not present

## 2017-03-17 DIAGNOSIS — M48061 Spinal stenosis, lumbar region without neurogenic claudication: Secondary | ICD-10-CM | POA: Diagnosis not present

## 2017-03-17 DIAGNOSIS — Z6824 Body mass index (BMI) 24.0-24.9, adult: Secondary | ICD-10-CM | POA: Diagnosis not present

## 2017-03-17 DIAGNOSIS — N528 Other male erectile dysfunction: Secondary | ICD-10-CM | POA: Diagnosis not present

## 2017-03-17 DIAGNOSIS — E784 Other hyperlipidemia: Secondary | ICD-10-CM | POA: Diagnosis not present

## 2017-03-17 DIAGNOSIS — R7301 Impaired fasting glucose: Secondary | ICD-10-CM | POA: Diagnosis not present

## 2017-03-17 DIAGNOSIS — Z1389 Encounter for screening for other disorder: Secondary | ICD-10-CM | POA: Diagnosis not present

## 2017-03-17 DIAGNOSIS — M5416 Radiculopathy, lumbar region: Secondary | ICD-10-CM | POA: Diagnosis not present

## 2017-04-05 ENCOUNTER — Ambulatory Visit (HOSPITAL_COMMUNITY)
Admission: RE | Admit: 2017-04-05 | Discharge: 2017-04-05 | Disposition: A | Discharge status: Home / Self Care | Payer: Medicare Other | Source: Ambulatory Visit | Attending: Cardiovascular Disease | Admitting: Cardiovascular Disease

## 2017-04-05 DIAGNOSIS — I6523 Occlusion and stenosis of bilateral carotid arteries: Secondary | ICD-10-CM | POA: Diagnosis not present

## 2017-04-05 DIAGNOSIS — I779 Disorder of arteries and arterioles, unspecified: Secondary | ICD-10-CM | POA: Diagnosis not present

## 2017-04-05 DIAGNOSIS — I739 Peripheral vascular disease, unspecified: Secondary | ICD-10-CM

## 2017-04-12 ENCOUNTER — Other Ambulatory Visit: Payer: Self-pay | Admitting: Cardiovascular Disease

## 2017-04-12 DIAGNOSIS — I739 Peripheral vascular disease, unspecified: Principal | ICD-10-CM

## 2017-04-12 DIAGNOSIS — I779 Disorder of arteries and arterioles, unspecified: Secondary | ICD-10-CM

## 2017-04-24 DIAGNOSIS — M5117 Intervertebral disc disorders with radiculopathy, lumbosacral region: Secondary | ICD-10-CM | POA: Diagnosis not present

## 2017-04-24 DIAGNOSIS — M9903 Segmental and somatic dysfunction of lumbar region: Secondary | ICD-10-CM | POA: Diagnosis not present

## 2017-04-24 DIAGNOSIS — M9905 Segmental and somatic dysfunction of pelvic region: Secondary | ICD-10-CM | POA: Diagnosis not present

## 2017-04-24 DIAGNOSIS — Q72811 Congenital shortening of right lower limb: Secondary | ICD-10-CM | POA: Diagnosis not present

## 2017-04-24 DIAGNOSIS — M5136 Other intervertebral disc degeneration, lumbar region: Secondary | ICD-10-CM | POA: Diagnosis not present

## 2017-04-24 DIAGNOSIS — M9904 Segmental and somatic dysfunction of sacral region: Secondary | ICD-10-CM | POA: Diagnosis not present

## 2017-04-24 DIAGNOSIS — M4316 Spondylolisthesis, lumbar region: Secondary | ICD-10-CM | POA: Diagnosis not present

## 2017-04-26 DIAGNOSIS — M5117 Intervertebral disc disorders with radiculopathy, lumbosacral region: Secondary | ICD-10-CM | POA: Diagnosis not present

## 2017-04-26 DIAGNOSIS — M9903 Segmental and somatic dysfunction of lumbar region: Secondary | ICD-10-CM | POA: Diagnosis not present

## 2017-04-26 DIAGNOSIS — Q72811 Congenital shortening of right lower limb: Secondary | ICD-10-CM | POA: Diagnosis not present

## 2017-04-26 DIAGNOSIS — M9904 Segmental and somatic dysfunction of sacral region: Secondary | ICD-10-CM | POA: Diagnosis not present

## 2017-04-26 DIAGNOSIS — M5136 Other intervertebral disc degeneration, lumbar region: Secondary | ICD-10-CM | POA: Diagnosis not present

## 2017-04-26 DIAGNOSIS — M4316 Spondylolisthesis, lumbar region: Secondary | ICD-10-CM | POA: Diagnosis not present

## 2017-04-26 DIAGNOSIS — M9905 Segmental and somatic dysfunction of pelvic region: Secondary | ICD-10-CM | POA: Diagnosis not present

## 2017-04-28 DIAGNOSIS — Q72811 Congenital shortening of right lower limb: Secondary | ICD-10-CM | POA: Diagnosis not present

## 2017-04-28 DIAGNOSIS — M4316 Spondylolisthesis, lumbar region: Secondary | ICD-10-CM | POA: Diagnosis not present

## 2017-04-28 DIAGNOSIS — M9905 Segmental and somatic dysfunction of pelvic region: Secondary | ICD-10-CM | POA: Diagnosis not present

## 2017-04-28 DIAGNOSIS — M5117 Intervertebral disc disorders with radiculopathy, lumbosacral region: Secondary | ICD-10-CM | POA: Diagnosis not present

## 2017-04-28 DIAGNOSIS — M9904 Segmental and somatic dysfunction of sacral region: Secondary | ICD-10-CM | POA: Diagnosis not present

## 2017-04-28 DIAGNOSIS — M5136 Other intervertebral disc degeneration, lumbar region: Secondary | ICD-10-CM | POA: Diagnosis not present

## 2017-04-28 DIAGNOSIS — M9903 Segmental and somatic dysfunction of lumbar region: Secondary | ICD-10-CM | POA: Diagnosis not present

## 2017-05-01 DIAGNOSIS — M5117 Intervertebral disc disorders with radiculopathy, lumbosacral region: Secondary | ICD-10-CM | POA: Diagnosis not present

## 2017-05-01 DIAGNOSIS — M4316 Spondylolisthesis, lumbar region: Secondary | ICD-10-CM | POA: Diagnosis not present

## 2017-05-01 DIAGNOSIS — Q72811 Congenital shortening of right lower limb: Secondary | ICD-10-CM | POA: Diagnosis not present

## 2017-05-01 DIAGNOSIS — M9904 Segmental and somatic dysfunction of sacral region: Secondary | ICD-10-CM | POA: Diagnosis not present

## 2017-05-01 DIAGNOSIS — M5136 Other intervertebral disc degeneration, lumbar region: Secondary | ICD-10-CM | POA: Diagnosis not present

## 2017-05-01 DIAGNOSIS — M9903 Segmental and somatic dysfunction of lumbar region: Secondary | ICD-10-CM | POA: Diagnosis not present

## 2017-05-01 DIAGNOSIS — M9905 Segmental and somatic dysfunction of pelvic region: Secondary | ICD-10-CM | POA: Diagnosis not present

## 2017-05-02 DIAGNOSIS — M9904 Segmental and somatic dysfunction of sacral region: Secondary | ICD-10-CM | POA: Diagnosis not present

## 2017-05-02 DIAGNOSIS — M5136 Other intervertebral disc degeneration, lumbar region: Secondary | ICD-10-CM | POA: Diagnosis not present

## 2017-05-02 DIAGNOSIS — M4316 Spondylolisthesis, lumbar region: Secondary | ICD-10-CM | POA: Diagnosis not present

## 2017-05-02 DIAGNOSIS — M9905 Segmental and somatic dysfunction of pelvic region: Secondary | ICD-10-CM | POA: Diagnosis not present

## 2017-05-02 DIAGNOSIS — M9903 Segmental and somatic dysfunction of lumbar region: Secondary | ICD-10-CM | POA: Diagnosis not present

## 2017-05-02 DIAGNOSIS — Q72811 Congenital shortening of right lower limb: Secondary | ICD-10-CM | POA: Diagnosis not present

## 2017-05-02 DIAGNOSIS — M5117 Intervertebral disc disorders with radiculopathy, lumbosacral region: Secondary | ICD-10-CM | POA: Diagnosis not present

## 2017-05-04 DIAGNOSIS — M5136 Other intervertebral disc degeneration, lumbar region: Secondary | ICD-10-CM | POA: Diagnosis not present

## 2017-05-04 DIAGNOSIS — M9905 Segmental and somatic dysfunction of pelvic region: Secondary | ICD-10-CM | POA: Diagnosis not present

## 2017-05-04 DIAGNOSIS — M5117 Intervertebral disc disorders with radiculopathy, lumbosacral region: Secondary | ICD-10-CM | POA: Diagnosis not present

## 2017-05-04 DIAGNOSIS — M4316 Spondylolisthesis, lumbar region: Secondary | ICD-10-CM | POA: Diagnosis not present

## 2017-05-04 DIAGNOSIS — M9904 Segmental and somatic dysfunction of sacral region: Secondary | ICD-10-CM | POA: Diagnosis not present

## 2017-05-04 DIAGNOSIS — M9903 Segmental and somatic dysfunction of lumbar region: Secondary | ICD-10-CM | POA: Diagnosis not present

## 2017-05-04 DIAGNOSIS — Q72811 Congenital shortening of right lower limb: Secondary | ICD-10-CM | POA: Diagnosis not present

## 2017-05-08 ENCOUNTER — Other Ambulatory Visit: Payer: Self-pay

## 2017-05-08 DIAGNOSIS — M9904 Segmental and somatic dysfunction of sacral region: Secondary | ICD-10-CM | POA: Diagnosis not present

## 2017-05-08 DIAGNOSIS — Q72811 Congenital shortening of right lower limb: Secondary | ICD-10-CM | POA: Diagnosis not present

## 2017-05-08 DIAGNOSIS — M4316 Spondylolisthesis, lumbar region: Secondary | ICD-10-CM | POA: Diagnosis not present

## 2017-05-08 DIAGNOSIS — M9905 Segmental and somatic dysfunction of pelvic region: Secondary | ICD-10-CM | POA: Diagnosis not present

## 2017-05-08 DIAGNOSIS — M5136 Other intervertebral disc degeneration, lumbar region: Secondary | ICD-10-CM | POA: Diagnosis not present

## 2017-05-08 DIAGNOSIS — M9903 Segmental and somatic dysfunction of lumbar region: Secondary | ICD-10-CM | POA: Diagnosis not present

## 2017-05-08 DIAGNOSIS — M5117 Intervertebral disc disorders with radiculopathy, lumbosacral region: Secondary | ICD-10-CM | POA: Diagnosis not present

## 2017-05-08 MED ORDER — CLOPIDOGREL BISULFATE 75 MG PO TABS
75.0000 mg | ORAL_TABLET | Freq: Every day | ORAL | 0 refills | Status: DC
Start: 2017-05-08 — End: 2017-08-11

## 2017-05-08 NOTE — Telephone Encounter (Signed)
Rx(s) sent to pharmacy electronically.  

## 2017-05-09 DIAGNOSIS — M9904 Segmental and somatic dysfunction of sacral region: Secondary | ICD-10-CM | POA: Diagnosis not present

## 2017-05-09 DIAGNOSIS — M4316 Spondylolisthesis, lumbar region: Secondary | ICD-10-CM | POA: Diagnosis not present

## 2017-05-09 DIAGNOSIS — M5136 Other intervertebral disc degeneration, lumbar region: Secondary | ICD-10-CM | POA: Diagnosis not present

## 2017-05-09 DIAGNOSIS — M5117 Intervertebral disc disorders with radiculopathy, lumbosacral region: Secondary | ICD-10-CM | POA: Diagnosis not present

## 2017-05-09 DIAGNOSIS — Q72811 Congenital shortening of right lower limb: Secondary | ICD-10-CM | POA: Diagnosis not present

## 2017-05-09 DIAGNOSIS — M9905 Segmental and somatic dysfunction of pelvic region: Secondary | ICD-10-CM | POA: Diagnosis not present

## 2017-05-09 DIAGNOSIS — M9903 Segmental and somatic dysfunction of lumbar region: Secondary | ICD-10-CM | POA: Diagnosis not present

## 2017-05-11 DIAGNOSIS — M5136 Other intervertebral disc degeneration, lumbar region: Secondary | ICD-10-CM | POA: Diagnosis not present

## 2017-05-11 DIAGNOSIS — M9905 Segmental and somatic dysfunction of pelvic region: Secondary | ICD-10-CM | POA: Diagnosis not present

## 2017-05-11 DIAGNOSIS — M5117 Intervertebral disc disorders with radiculopathy, lumbosacral region: Secondary | ICD-10-CM | POA: Diagnosis not present

## 2017-05-11 DIAGNOSIS — Q72811 Congenital shortening of right lower limb: Secondary | ICD-10-CM | POA: Diagnosis not present

## 2017-05-11 DIAGNOSIS — M4316 Spondylolisthesis, lumbar region: Secondary | ICD-10-CM | POA: Diagnosis not present

## 2017-05-11 DIAGNOSIS — M9903 Segmental and somatic dysfunction of lumbar region: Secondary | ICD-10-CM | POA: Diagnosis not present

## 2017-05-11 DIAGNOSIS — M9904 Segmental and somatic dysfunction of sacral region: Secondary | ICD-10-CM | POA: Diagnosis not present

## 2017-05-19 DIAGNOSIS — M9903 Segmental and somatic dysfunction of lumbar region: Secondary | ICD-10-CM | POA: Diagnosis not present

## 2017-05-19 DIAGNOSIS — M9905 Segmental and somatic dysfunction of pelvic region: Secondary | ICD-10-CM | POA: Diagnosis not present

## 2017-05-19 DIAGNOSIS — M9904 Segmental and somatic dysfunction of sacral region: Secondary | ICD-10-CM | POA: Diagnosis not present

## 2017-05-19 DIAGNOSIS — M4316 Spondylolisthesis, lumbar region: Secondary | ICD-10-CM | POA: Diagnosis not present

## 2017-05-19 DIAGNOSIS — Q72811 Congenital shortening of right lower limb: Secondary | ICD-10-CM | POA: Diagnosis not present

## 2017-05-19 DIAGNOSIS — M5117 Intervertebral disc disorders with radiculopathy, lumbosacral region: Secondary | ICD-10-CM | POA: Diagnosis not present

## 2017-05-19 DIAGNOSIS — M5136 Other intervertebral disc degeneration, lumbar region: Secondary | ICD-10-CM | POA: Diagnosis not present

## 2017-05-22 DIAGNOSIS — M4316 Spondylolisthesis, lumbar region: Secondary | ICD-10-CM | POA: Diagnosis not present

## 2017-05-22 DIAGNOSIS — M9905 Segmental and somatic dysfunction of pelvic region: Secondary | ICD-10-CM | POA: Diagnosis not present

## 2017-05-22 DIAGNOSIS — M5117 Intervertebral disc disorders with radiculopathy, lumbosacral region: Secondary | ICD-10-CM | POA: Diagnosis not present

## 2017-05-22 DIAGNOSIS — M9903 Segmental and somatic dysfunction of lumbar region: Secondary | ICD-10-CM | POA: Diagnosis not present

## 2017-05-22 DIAGNOSIS — Q72811 Congenital shortening of right lower limb: Secondary | ICD-10-CM | POA: Diagnosis not present

## 2017-05-22 DIAGNOSIS — M5136 Other intervertebral disc degeneration, lumbar region: Secondary | ICD-10-CM | POA: Diagnosis not present

## 2017-05-22 DIAGNOSIS — M9904 Segmental and somatic dysfunction of sacral region: Secondary | ICD-10-CM | POA: Diagnosis not present

## 2017-05-26 DIAGNOSIS — M5117 Intervertebral disc disorders with radiculopathy, lumbosacral region: Secondary | ICD-10-CM | POA: Diagnosis not present

## 2017-05-26 DIAGNOSIS — M9905 Segmental and somatic dysfunction of pelvic region: Secondary | ICD-10-CM | POA: Diagnosis not present

## 2017-05-26 DIAGNOSIS — M9903 Segmental and somatic dysfunction of lumbar region: Secondary | ICD-10-CM | POA: Diagnosis not present

## 2017-05-26 DIAGNOSIS — M4316 Spondylolisthesis, lumbar region: Secondary | ICD-10-CM | POA: Diagnosis not present

## 2017-05-26 DIAGNOSIS — Q72811 Congenital shortening of right lower limb: Secondary | ICD-10-CM | POA: Diagnosis not present

## 2017-05-26 DIAGNOSIS — M9904 Segmental and somatic dysfunction of sacral region: Secondary | ICD-10-CM | POA: Diagnosis not present

## 2017-05-26 DIAGNOSIS — M5136 Other intervertebral disc degeneration, lumbar region: Secondary | ICD-10-CM | POA: Diagnosis not present

## 2017-05-30 ENCOUNTER — Ambulatory Visit: Payer: Medicare Other | Admitting: Cardiovascular Disease

## 2017-05-30 DIAGNOSIS — M4316 Spondylolisthesis, lumbar region: Secondary | ICD-10-CM | POA: Diagnosis not present

## 2017-05-30 DIAGNOSIS — M9904 Segmental and somatic dysfunction of sacral region: Secondary | ICD-10-CM | POA: Diagnosis not present

## 2017-05-30 DIAGNOSIS — M9905 Segmental and somatic dysfunction of pelvic region: Secondary | ICD-10-CM | POA: Diagnosis not present

## 2017-05-30 DIAGNOSIS — Q72811 Congenital shortening of right lower limb: Secondary | ICD-10-CM | POA: Diagnosis not present

## 2017-05-30 DIAGNOSIS — M9903 Segmental and somatic dysfunction of lumbar region: Secondary | ICD-10-CM | POA: Diagnosis not present

## 2017-05-30 DIAGNOSIS — M5136 Other intervertebral disc degeneration, lumbar region: Secondary | ICD-10-CM | POA: Diagnosis not present

## 2017-05-30 DIAGNOSIS — M5117 Intervertebral disc disorders with radiculopathy, lumbosacral region: Secondary | ICD-10-CM | POA: Diagnosis not present

## 2017-06-01 DIAGNOSIS — Q72811 Congenital shortening of right lower limb: Secondary | ICD-10-CM | POA: Diagnosis not present

## 2017-06-01 DIAGNOSIS — M5117 Intervertebral disc disorders with radiculopathy, lumbosacral region: Secondary | ICD-10-CM | POA: Diagnosis not present

## 2017-06-01 DIAGNOSIS — M9903 Segmental and somatic dysfunction of lumbar region: Secondary | ICD-10-CM | POA: Diagnosis not present

## 2017-06-01 DIAGNOSIS — M9904 Segmental and somatic dysfunction of sacral region: Secondary | ICD-10-CM | POA: Diagnosis not present

## 2017-06-01 DIAGNOSIS — M9905 Segmental and somatic dysfunction of pelvic region: Secondary | ICD-10-CM | POA: Diagnosis not present

## 2017-06-01 DIAGNOSIS — M5136 Other intervertebral disc degeneration, lumbar region: Secondary | ICD-10-CM | POA: Diagnosis not present

## 2017-06-01 DIAGNOSIS — M4316 Spondylolisthesis, lumbar region: Secondary | ICD-10-CM | POA: Diagnosis not present

## 2017-06-13 DIAGNOSIS — M4316 Spondylolisthesis, lumbar region: Secondary | ICD-10-CM | POA: Diagnosis not present

## 2017-06-13 DIAGNOSIS — M5117 Intervertebral disc disorders with radiculopathy, lumbosacral region: Secondary | ICD-10-CM | POA: Diagnosis not present

## 2017-06-13 DIAGNOSIS — M9904 Segmental and somatic dysfunction of sacral region: Secondary | ICD-10-CM | POA: Diagnosis not present

## 2017-06-13 DIAGNOSIS — M9903 Segmental and somatic dysfunction of lumbar region: Secondary | ICD-10-CM | POA: Diagnosis not present

## 2017-06-13 DIAGNOSIS — Q72811 Congenital shortening of right lower limb: Secondary | ICD-10-CM | POA: Diagnosis not present

## 2017-06-13 DIAGNOSIS — M9905 Segmental and somatic dysfunction of pelvic region: Secondary | ICD-10-CM | POA: Diagnosis not present

## 2017-06-13 DIAGNOSIS — M5136 Other intervertebral disc degeneration, lumbar region: Secondary | ICD-10-CM | POA: Diagnosis not present

## 2017-06-15 DIAGNOSIS — M5117 Intervertebral disc disorders with radiculopathy, lumbosacral region: Secondary | ICD-10-CM | POA: Diagnosis not present

## 2017-06-15 DIAGNOSIS — M4316 Spondylolisthesis, lumbar region: Secondary | ICD-10-CM | POA: Diagnosis not present

## 2017-06-15 DIAGNOSIS — M9903 Segmental and somatic dysfunction of lumbar region: Secondary | ICD-10-CM | POA: Diagnosis not present

## 2017-06-15 DIAGNOSIS — M9904 Segmental and somatic dysfunction of sacral region: Secondary | ICD-10-CM | POA: Diagnosis not present

## 2017-06-15 DIAGNOSIS — Q72811 Congenital shortening of right lower limb: Secondary | ICD-10-CM | POA: Diagnosis not present

## 2017-06-15 DIAGNOSIS — M5136 Other intervertebral disc degeneration, lumbar region: Secondary | ICD-10-CM | POA: Diagnosis not present

## 2017-06-15 DIAGNOSIS — M9905 Segmental and somatic dysfunction of pelvic region: Secondary | ICD-10-CM | POA: Diagnosis not present

## 2017-06-16 ENCOUNTER — Ambulatory Visit (INDEPENDENT_AMBULATORY_CARE_PROVIDER_SITE_OTHER): Payer: Medicare Other | Admitting: Cardiovascular Disease

## 2017-06-16 ENCOUNTER — Encounter: Payer: Self-pay | Admitting: Cardiovascular Disease

## 2017-06-16 VITALS — BP 148/94 | HR 71 | Ht 66.0 in | Wt 149.0 lb

## 2017-06-16 DIAGNOSIS — I739 Peripheral vascular disease, unspecified: Principal | ICD-10-CM

## 2017-06-16 DIAGNOSIS — I4510 Unspecified right bundle-branch block: Secondary | ICD-10-CM | POA: Diagnosis not present

## 2017-06-16 DIAGNOSIS — E7800 Pure hypercholesterolemia, unspecified: Secondary | ICD-10-CM

## 2017-06-16 DIAGNOSIS — I779 Disorder of arteries and arterioles, unspecified: Secondary | ICD-10-CM

## 2017-06-16 NOTE — Progress Notes (Signed)
06/16/2017 Theodore Allen   10-Nov-1936  [id number]  Primary Physician Theodore Redwood, MD Primary Cardiologist: Theodore Harp MD Theodore Allen  HPI:   The patient is a delightful 81 year old fit-appearing married Caucasian male, father of 38, grandfather to 5 grandchildren, whom I saw 06/09/16. We have been following an asymptomatic right internal carotid artery stenosis by duplex ultrasound on an annual basis. This has remained remarkably stable and he is neurologically asymptomatic. His other problems include hyperlipidemia. He denies chest pain or shortness of breath. His last Myoview performed 11/04/08 was normal. He also has a chronic right bundle-branch block   Current Meds  Medication Sig  . Ascorbic Acid (VITAMIN C) 500 MG CAPS Take 500 mg by mouth 2 (two) times daily.   Marland Kitchen aspirin 81 MG tablet Take 81 mg by mouth daily.  . calcium-vitamin D (OSCAL WITH D) 500-200 MG-UNIT tablet Take 1 tablet by mouth.  . Cinnamon 500 MG TABS Take 500 mg by mouth daily.  . clopidogrel (PLAVIX) 75 MG tablet Take 1 tablet (75 mg total) by mouth daily. Must keep appointment with Dr Theodore Allen on 05/30/17 for future refills.  . Coenzyme Q10 (COQ10 PO) Take 1 capsule by mouth daily.   . CRESTOR 40 MG tablet Take 20 mg by mouth daily.  . fexofenadine (ALLEGRA) 180 MG tablet Take 180 mg by mouth daily.  . Glucosamine-Chondroitin (GLUCOSAMINE CHONDR COMPLEX PO) Take by mouth. Take 75/100 mg one tablet daily.  Marland Kitchen GRAPE SEED EXTRACT PO Take by mouth.  . Multiple Vitamin (MULTIVITAMIN) tablet Take 1 tablet by mouth daily.  . Omega-3 Fatty Acids (OMEGA 3 PO) Take 2 g by mouth daily.  [name] Glycol-Propyl Glycol (SYSTANE OP) Place 1 drop into both eyes 2 (two) times daily as needed (for dry eyes).  . Probiotic Product (PROBIOTIC DAILY PO) Take by mouth.  . TURMERIC PO Take by mouth.  . vitamin E 400 UNIT capsule Take 400 Units by mouth daily.     No Known Allergies  Social History   Social  History  . Marital status: Married    Spouse name: N/A  . Number of children: N/A  . Years of education: N/A   Occupational History  . Not on file.   Social History Main Topics  . Smoking status: Never Smoker  . Smokeless tobacco: Never Used  . Alcohol use 0.5 - 1.0 oz/week    1 - 2 Standard drinks or equivalent per week  . Drug use: Unknown  . Sexual activity: Not on file   Other Topics Concern  . Not on file   Social History Narrative  . No narrative on file     Review of Systems: General: negative for chills, fever, night sweats or weight changes.  Cardiovascular: negative for chest pain, dyspnea on exertion, edema, orthopnea, palpitations, paroxysmal nocturnal dyspnea or shortness of breath Dermatological: negative for rash Respiratory: negative for cough or wheezing Urologic: negative for hematuria Abdominal: negative for nausea, vomiting, diarrhea, bright red blood per rectum, melena, or hematemesis Neurologic: negative for visual changes, syncope, or dizziness All other systems reviewed and are otherwise negative except as noted above.    Blood pressure (!) 148/94, pulse 71, height 5\' 6"  (1.676 m), weight 149 lb (67.6 kg).  General appearance: alert and no distress Neck: no adenopathy, no carotid bruit, no JVD, supple, symmetrical, trachea midline and thyroid not enlarged, symmetric, no tenderness/mass/nodules Lungs: clear to auscultation bilaterally Heart: regular rate and rhythm, S1, S2  normal, no murmur, click, rub or gallop Extremities: extremities normal, atraumatic, no cyanosis or edema  EKG sinus rhythm at 71 bundle-branch block. I personally reviewed this EKG.  ASSESSMENT AND PLAN:   Carotid artery disease History of asymptomatic moderate right ICA stenosis by duplex ultrasound recently checked 04/05/17. This had remained stable and will be followed on an annual basis.  Hyperlipidemia History of hyperlipidemia. On statin therapy followed by his  PCP  Right bundle branch block Chronic      Theodore Harp MD [facility], [facility] 06/16/2017 11:05 AM

## 2017-06-16 NOTE — Patient Instructions (Signed)
Medication Instructions: Your physician recommends that you continue on your current medications as directed. Please refer to the Current Medication list given to you today.   Testing/Procedures: Your physician has requested that you have a carotid duplex in May 2019. This test is an ultrasound of the carotid arteries in your neck. It looks at blood flow through these arteries that supply the brain with blood. Allow one hour for this exam. There are no restrictions or special instructions.  Follow-Up: Your physician wants you to follow-up in: 1 year with Dr. Veleta Miners doppler. You will receive a reminder letter in the mail two months in advance. If you don't receive a letter, please call our office to schedule the follow-up appointment.  If you need a refill on your cardiac medications before your next appointment, please call your pharmacy.

## 2017-06-16 NOTE — Assessment & Plan Note (Signed)
History of asymptomatic moderate right ICA stenosis by duplex ultrasound recently checked 04/05/17. This had remained stable and will be followed on an annual basis.

## 2017-06-16 NOTE — Assessment & Plan Note (Signed)
History of hyperlipidemia. On statin therapy followed by his PCP

## 2017-06-16 NOTE — Assessment & Plan Note (Signed)
Chronic. 

## 2017-06-22 DIAGNOSIS — M9905 Segmental and somatic dysfunction of pelvic region: Secondary | ICD-10-CM | POA: Diagnosis not present

## 2017-06-22 DIAGNOSIS — M5136 Other intervertebral disc degeneration, lumbar region: Secondary | ICD-10-CM | POA: Diagnosis not present

## 2017-06-22 DIAGNOSIS — M5117 Intervertebral disc disorders with radiculopathy, lumbosacral region: Secondary | ICD-10-CM | POA: Diagnosis not present

## 2017-06-22 DIAGNOSIS — M9903 Segmental and somatic dysfunction of lumbar region: Secondary | ICD-10-CM | POA: Diagnosis not present

## 2017-06-22 DIAGNOSIS — Q72811 Congenital shortening of right lower limb: Secondary | ICD-10-CM | POA: Diagnosis not present

## 2017-06-22 DIAGNOSIS — M9904 Segmental and somatic dysfunction of sacral region: Secondary | ICD-10-CM | POA: Diagnosis not present

## 2017-06-22 DIAGNOSIS — M4316 Spondylolisthesis, lumbar region: Secondary | ICD-10-CM | POA: Diagnosis not present

## 2017-07-07 DIAGNOSIS — M5117 Intervertebral disc disorders with radiculopathy, lumbosacral region: Secondary | ICD-10-CM | POA: Diagnosis not present

## 2017-07-07 DIAGNOSIS — M9904 Segmental and somatic dysfunction of sacral region: Secondary | ICD-10-CM | POA: Diagnosis not present

## 2017-07-07 DIAGNOSIS — M9905 Segmental and somatic dysfunction of pelvic region: Secondary | ICD-10-CM | POA: Diagnosis not present

## 2017-07-07 DIAGNOSIS — M9903 Segmental and somatic dysfunction of lumbar region: Secondary | ICD-10-CM | POA: Diagnosis not present

## 2017-07-07 DIAGNOSIS — M4316 Spondylolisthesis, lumbar region: Secondary | ICD-10-CM | POA: Diagnosis not present

## 2017-07-07 DIAGNOSIS — Q72811 Congenital shortening of right lower limb: Secondary | ICD-10-CM | POA: Diagnosis not present

## 2017-07-07 DIAGNOSIS — M5136 Other intervertebral disc degeneration, lumbar region: Secondary | ICD-10-CM | POA: Diagnosis not present

## 2017-07-20 DIAGNOSIS — M5136 Other intervertebral disc degeneration, lumbar region: Secondary | ICD-10-CM | POA: Diagnosis not present

## 2017-07-20 DIAGNOSIS — Q72811 Congenital shortening of right lower limb: Secondary | ICD-10-CM | POA: Diagnosis not present

## 2017-07-20 DIAGNOSIS — M9905 Segmental and somatic dysfunction of pelvic region: Secondary | ICD-10-CM | POA: Diagnosis not present

## 2017-07-20 DIAGNOSIS — M9904 Segmental and somatic dysfunction of sacral region: Secondary | ICD-10-CM | POA: Diagnosis not present

## 2017-07-20 DIAGNOSIS — M5117 Intervertebral disc disorders with radiculopathy, lumbosacral region: Secondary | ICD-10-CM | POA: Diagnosis not present

## 2017-07-20 DIAGNOSIS — M9903 Segmental and somatic dysfunction of lumbar region: Secondary | ICD-10-CM | POA: Diagnosis not present

## 2017-07-20 DIAGNOSIS — M4316 Spondylolisthesis, lumbar region: Secondary | ICD-10-CM | POA: Diagnosis not present

## 2017-08-11 ENCOUNTER — Other Ambulatory Visit: Payer: Self-pay

## 2017-08-11 MED ORDER — CLOPIDOGREL BISULFATE 75 MG PO TABS: 75.0000 mg | ORAL_TABLET | Freq: Every day | ORAL | 3 refills | Status: DC

## 2017-08-15 DIAGNOSIS — Z23 Encounter for immunization: Secondary | ICD-10-CM | POA: Diagnosis not present

## 2017-08-17 ENCOUNTER — Other Ambulatory Visit: Payer: Self-pay

## 2017-08-17 DIAGNOSIS — M9905 Segmental and somatic dysfunction of pelvic region: Secondary | ICD-10-CM | POA: Diagnosis not present

## 2017-08-17 DIAGNOSIS — M5117 Intervertebral disc disorders with radiculopathy, lumbosacral region: Secondary | ICD-10-CM | POA: Diagnosis not present

## 2017-08-17 DIAGNOSIS — M5136 Other intervertebral disc degeneration, lumbar region: Secondary | ICD-10-CM | POA: Diagnosis not present

## 2017-08-17 DIAGNOSIS — M9904 Segmental and somatic dysfunction of sacral region: Secondary | ICD-10-CM | POA: Diagnosis not present

## 2017-08-17 DIAGNOSIS — Q72811 Congenital shortening of right lower limb: Secondary | ICD-10-CM | POA: Diagnosis not present

## 2017-08-17 DIAGNOSIS — M9903 Segmental and somatic dysfunction of lumbar region: Secondary | ICD-10-CM | POA: Diagnosis not present

## 2017-08-17 DIAGNOSIS — M4316 Spondylolisthesis, lumbar region: Secondary | ICD-10-CM | POA: Diagnosis not present

## 2017-08-17 MED ORDER — CLOPIDOGREL BISULFATE 75 MG PO TABS: 75.0000 mg | ORAL_TABLET | Freq: Every day | ORAL | 3 refills | Status: DC

## 2017-08-18 ENCOUNTER — Telehealth: Payer: Self-pay | Admitting: Cardiovascular Disease

## 2017-08-18 MED ORDER — CLOPIDOGREL BISULFATE 75 MG PO TABS: 75.0000 mg | ORAL_TABLET | Freq: Every day | ORAL | 3 refills | Status: DC

## 2017-08-18 NOTE — Telephone Encounter (Signed)
F/u Message  Pt states he wants his medication to go through the [place]. He was calling to make sure that change was successful.please call back

## 2017-08-18 NOTE — Telephone Encounter (Signed)
New message       *STAT* If patient is at the pharmacy, call can be transferred to refill team.   1. Which medications need to be refilled? (please list name of each medication and dose if known)  Generic plavix 75 mg  2. Which pharmacy/location (including street and city if local pharmacy) is medication to be sent to? presc was sent to wrong pharmacy.  Now pt only has 2 pills left.  Please call in a 14 day supply to [facility] and call in the rest of the presc to [facility] mail order pharmacy.  Also, pt is requesting refills for 1 year at [facility].  3. Do they need a 30 day or 90 day supply?   Call if there is a problem

## 2017-08-18 NOTE — Telephone Encounter (Signed)
Informed patient that Plavix rx was sent into [facility]. Patient verbalized understanding.

## 2017-09-13 DIAGNOSIS — Z08 Encounter for follow-up examination after completed treatment for malignant neoplasm: Secondary | ICD-10-CM | POA: Diagnosis not present

## 2017-09-13 DIAGNOSIS — L570 Actinic keratosis: Secondary | ICD-10-CM | POA: Diagnosis not present

## 2017-09-13 DIAGNOSIS — Z85828 Personal history of other malignant neoplasm of skin: Secondary | ICD-10-CM | POA: Diagnosis not present

## 2017-09-15 DIAGNOSIS — H6122 Impacted cerumen, left ear: Secondary | ICD-10-CM | POA: Diagnosis not present

## 2017-09-15 DIAGNOSIS — Z6825 Body mass index (BMI) 25.0-25.9, adult: Secondary | ICD-10-CM | POA: Diagnosis not present

## 2017-09-15 DIAGNOSIS — J3089 Other allergic rhinitis: Secondary | ICD-10-CM | POA: Diagnosis not present

## 2017-09-15 DIAGNOSIS — R05 Cough: Secondary | ICD-10-CM | POA: Diagnosis not present

## 2017-10-18 DIAGNOSIS — H47321 Drusen of optic disc, right eye: Secondary | ICD-10-CM | POA: Diagnosis not present

## 2017-12-15 DIAGNOSIS — L821 Other seborrheic keratosis: Secondary | ICD-10-CM | POA: Diagnosis not present

## 2017-12-15 DIAGNOSIS — Z85828 Personal history of other malignant neoplasm of skin: Secondary | ICD-10-CM | POA: Diagnosis not present

## 2017-12-15 DIAGNOSIS — D1801 Hemangioma of skin and subcutaneous tissue: Secondary | ICD-10-CM | POA: Diagnosis not present

## 2017-12-15 DIAGNOSIS — L814 Other melanin hyperpigmentation: Secondary | ICD-10-CM | POA: Diagnosis not present

## 2017-12-15 DIAGNOSIS — L570 Actinic keratosis: Secondary | ICD-10-CM | POA: Diagnosis not present

## 2018-03-12 ENCOUNTER — Other Ambulatory Visit: Payer: Self-pay

## 2018-03-12 MED ORDER — CLOPIDOGREL BISULFATE 75 MG PO TABS: 75.0000 mg | ORAL_TABLET | Freq: Every day | ORAL | 0 refills | Status: DC

## 2018-04-09 ENCOUNTER — Other Ambulatory Visit: Payer: Self-pay | Admitting: Cardiovascular Disease

## 2018-04-09 ENCOUNTER — Ambulatory Visit (HOSPITAL_COMMUNITY)
Admission: RE | Admit: 2018-04-09 | Discharge: 2018-04-09 | Disposition: A | Discharge status: Home / Self Care | Payer: Medicare Other | Source: Ambulatory Visit | Attending: Cardiovascular Disease | Admitting: Cardiovascular Disease

## 2018-04-09 DIAGNOSIS — I6523 Occlusion and stenosis of bilateral carotid arteries: Secondary | ICD-10-CM | POA: Diagnosis not present

## 2018-04-09 DIAGNOSIS — E785 Hyperlipidemia, unspecified: Secondary | ICD-10-CM | POA: Insufficient documentation

## 2018-04-10 ENCOUNTER — Other Ambulatory Visit: Payer: Self-pay

## 2018-04-10 DIAGNOSIS — I6523 Occlusion and stenosis of bilateral carotid arteries: Secondary | ICD-10-CM

## 2018-04-30 ENCOUNTER — Encounter: Payer: Self-pay | Admitting: Cardiovascular Disease

## 2018-04-30 DIAGNOSIS — E7849 Other hyperlipidemia: Secondary | ICD-10-CM | POA: Diagnosis not present

## 2018-04-30 DIAGNOSIS — R7301 Impaired fasting glucose: Secondary | ICD-10-CM | POA: Diagnosis not present

## 2018-04-30 DIAGNOSIS — R82998 Other abnormal findings in urine: Secondary | ICD-10-CM | POA: Diagnosis not present

## 2018-05-07 DIAGNOSIS — N183 Chronic kidney disease, stage 3 (moderate): Secondary | ICD-10-CM | POA: Diagnosis not present

## 2018-05-07 DIAGNOSIS — I6529 Occlusion and stenosis of unspecified carotid artery: Secondary | ICD-10-CM | POA: Diagnosis not present

## 2018-05-07 DIAGNOSIS — R634 Abnormal weight loss: Secondary | ICD-10-CM | POA: Diagnosis not present

## 2018-05-07 DIAGNOSIS — Z6824 Body mass index (BMI) 24.0-24.9, adult: Secondary | ICD-10-CM | POA: Diagnosis not present

## 2018-05-07 DIAGNOSIS — Z1389 Encounter for screening for other disorder: Secondary | ICD-10-CM | POA: Diagnosis not present

## 2018-05-07 DIAGNOSIS — Z0000 Encounter for general adult medical examination without abnormal findings: Secondary | ICD-10-CM | POA: Diagnosis not present

## 2018-05-07 DIAGNOSIS — R7301 Impaired fasting glucose: Secondary | ICD-10-CM | POA: Diagnosis not present

## 2018-05-07 DIAGNOSIS — J309 Allergic rhinitis, unspecified: Secondary | ICD-10-CM | POA: Diagnosis not present

## 2018-05-07 DIAGNOSIS — E7849 Other hyperlipidemia: Secondary | ICD-10-CM | POA: Diagnosis not present

## 2018-05-10 DIAGNOSIS — Z1212 Encounter for screening for malignant neoplasm of rectum: Secondary | ICD-10-CM | POA: Diagnosis not present

## 2018-06-19 ENCOUNTER — Encounter: Payer: Self-pay | Admitting: Cardiovascular Disease

## 2018-06-19 ENCOUNTER — Ambulatory Visit (INDEPENDENT_AMBULATORY_CARE_PROVIDER_SITE_OTHER): Payer: Medicare Other | Admitting: Cardiovascular Disease

## 2018-06-19 DIAGNOSIS — I4510 Unspecified right bundle-branch block: Secondary | ICD-10-CM | POA: Diagnosis not present

## 2018-06-19 DIAGNOSIS — I6521 Occlusion and stenosis of right carotid artery: Secondary | ICD-10-CM

## 2018-06-19 DIAGNOSIS — E7800 Pure hypercholesterolemia, unspecified: Secondary | ICD-10-CM | POA: Diagnosis not present

## 2018-06-19 DIAGNOSIS — I6523 Occlusion and stenosis of bilateral carotid arteries: Secondary | ICD-10-CM

## 2018-06-19 NOTE — Patient Instructions (Signed)
Medication Instructions:  Your physician recommends that you continue on your current medications as directed. Please refer to the Current Medication list given to you today.   Labwork: none  Testing/Procedures: Your physician has requested that you have a carotid duplex. This test is an ultrasound of the carotid arteries in your neck. It looks at blood flow through these arteries that supply the brain with blood. Allow one hour for this exam. There are no restrictions or special instructions. IN ONE YEAR   Follow-Up: Your physician wants you to follow-up in: 12 months with Dr. Andria Rhein will receive a reminder letter in the mail two months in advance. If you don't receive a letter, please call our office to schedule the follow-up appointment.   Any Other Special Instructions Will Be Listed Below (If Applicable).     If you need a refill on your cardiac medications before your next appointment, please call your pharmacy.

## 2018-06-19 NOTE — Assessment & Plan Note (Signed)
History of asymptomatic moderate right ICA stenosis most recently checked 04/09/2018 without change.  We will recheck in 1 year.

## 2018-06-19 NOTE — Assessment & Plan Note (Signed)
History of hyperlipidemia on statin therapy with lipid profile recently performed by his PCP on 04/30/2018 revealing total cholesterol 52, LDL 77 and HDL of 59.

## 2018-06-19 NOTE — Progress Notes (Signed)
06/19/2018 Theodore Allen   Mar 21, 1936  [id number]  Primary Physician Marton Redwood, MD Primary Cardiologist: Lorretta Harp MD Lupe Carney, [place]  HPI:  Burke Terry is a 82 y.o.  fit-appearing married Caucasian male, father of 3, grandfather to 5 grandchildren, whom I saw  06/16/2017. We have been following an asymptomatic right internal carotid artery stenosis by duplex ultrasound on an annual basis. This has remained remarkably stable and he is neurologically asymptomatic. His other problems include hyperlipidemia. He denies chest pain or shortness of breath. His last Myoview performed 11/04/08 was normal. He also has a chronic right bundle-branch block      Current Meds  Medication Sig  . Ascorbic Acid (VITAMIN C) 500 MG CAPS Take 500 mg by mouth 2 (two) times daily.   Marland Kitchen aspirin 81 MG tablet Take 81 mg by mouth daily.  . calcium-vitamin D (OSCAL WITH D) 500-200 MG-UNIT tablet Take 1 tablet by mouth.  . Cinnamon 500 MG TABS Take 500 mg by mouth daily.  . clopidogrel (PLAVIX) 75 MG tablet Take 1 tablet (75 mg total) by mouth daily. PLEASE CONTACT OUR OFFICE FOR AN OFFICE APPOINTMENT  . Coenzyme Q10 (COQ10 PO) Take 1 capsule by mouth daily.   . fexofenadine (ALLEGRA) 180 MG tablet Take 180 mg by mouth daily.  . Glucosamine-Chondroitin (GLUCOSAMINE CHONDR COMPLEX PO) Take by mouth. Take 75/100 mg one tablet daily.  Marland Kitchen GRAPE SEED EXTRACT PO Take by mouth.  . Multiple Vitamin (MULTIVITAMIN) tablet Take 1 tablet by mouth daily.  . Omega-3 Fatty Acids (OMEGA 3 PO) Take 2 g by mouth daily.  [name] Glycol-Propyl Glycol (SYSTANE OP) Place 1 drop into both eyes 2 (two) times daily as needed (for dry eyes).  . Probiotic Product (PROBIOTIC DAILY PO) Take by mouth.  . rosuvastatin (CRESTOR) 20 MG tablet Take 20 mg by mouth daily.  . TURMERIC PO Take by mouth.  . vitamin E 400 UNIT capsule Take 400 Units by mouth daily.     No Known Allergies  Social History   Socioeconomic  History  . Marital status: Married    Spouse name: Not on file  . Number of children: Not on file  . Years of education: Not on file  . Highest education level: Not on file  Occupational History  . Not on file  Social Needs  . Financial resource strain: Not on file  . Food insecurity:    Worry: Not on file    Inability: Not on file  . Transportation needs:    Medical: Not on file    Non-medical: Not on file  Tobacco Use  . Smoking status: Never Smoker  . Smokeless tobacco: Never Used  Substance and Sexual Activity  . Alcohol use: Yes    Alcohol/week: 0.6 - 1.2 oz    Types: 1 - 2 Standard drinks or equivalent per week  . Drug use: Not on file  . Sexual activity: Not on file  Lifestyle  . Physical activity:    Days per week: Not on file    Minutes per session: Not on file  . Stress: Not on file  Relationships  . Social connections:    Talks on phone: Not on file    Gets together: Not on file    Attends religious service: Not on file    Active member of club or organization: Not on file    Attends meetings of clubs or organizations: Not on file    Relationship  status: Not on file  . Intimate partner violence:    Fear of current or ex partner: Not on file    Emotionally abused: Not on file    Physically abused: Not on file    Forced sexual activity: Not on file  Other Topics Concern  . Not on file  Social History Narrative  . Not on file     Review of Systems: General: negative for chills, fever, night sweats or weight changes.  Cardiovascular: negative for chest pain, dyspnea on exertion, edema, orthopnea, palpitations, paroxysmal nocturnal dyspnea or shortness of breath Dermatological: negative for rash Respiratory: negative for cough or wheezing Urologic: negative for hematuria Abdominal: negative for nausea, vomiting, diarrhea, bright red blood per rectum, melena, or hematemesis Neurologic: negative for visual changes, syncope, or dizziness All other systems  reviewed and are otherwise negative except as noted above.    Blood pressure 136/80, pulse 66, height 5\' 6"  (1.676 m), weight 148 lb (67.1 kg).  General appearance: alert and no distress Neck: no adenopathy, no JVD, supple, symmetrical, trachea midline, thyroid not enlarged, symmetric, no tenderness/mass/nodules and Soft right carotid bruit Lungs: clear to auscultation bilaterally Heart: regular rate and rhythm, S1, S2 normal, no murmur, click, rub or gallop Extremities: extremities normal, atraumatic, no cyanosis or edema Pulses: 2+ and symmetric Skin: Skin color, texture, turgor normal. No rashes or lesions Neurologic: Alert and oriented X 3, normal strength and tone. Normal symmetric reflexes. Normal coordination and gait  EKG sinus rhythm at 66 with right bundle branch block.  I personally reviewed this EKG.  ASSESSMENT AND PLAN:   Carotid artery disease History of asymptomatic moderate right ICA stenosis most recently checked 04/09/2018 without change.  We will recheck in 1 year.  Hyperlipidemia History of hyperlipidemia on statin therapy with lipid profile recently performed by his PCP on 04/30/2018 revealing total cholesterol 52, LDL 77 and HDL of 59.  Right bundle branch block Chronic      Lorretta Harp MD [facility], [facility] 06/19/2018 11:33 AM

## 2018-06-19 NOTE — Assessment & Plan Note (Signed)
Chronic. 

## 2018-07-12 ENCOUNTER — Telehealth: Payer: Self-pay | Admitting: Cardiovascular Disease

## 2018-07-12 MED ORDER — CLOPIDOGREL BISULFATE 75 MG PO TABS: 75.0000 mg | ORAL_TABLET | Freq: Every day | ORAL | 3 refills | Status: DC

## 2018-07-12 NOTE — Telephone Encounter (Signed)
New Message:   *STAT* If patient is at the pharmacy, call can be transferred to refill team.   1. Which medications need to be refilled? (please list name of each medication and dose if known) clopidogrel (PLAVIX) 75 MG tablet  2. Which pharmacy/location (including street and city if local pharmacy) is medication to be sent to? [facility] [place], [place] AT Portal to Registered [facility] Sites  3. Do they need a 30 day or 90 day supply? 90 Days

## 2018-07-19 ENCOUNTER — Other Ambulatory Visit: Payer: Self-pay

## 2018-07-19 ENCOUNTER — Other Ambulatory Visit: Payer: Self-pay | Admitting: Cardiovascular Disease

## 2018-07-19 MED ORDER — CLOPIDOGREL BISULFATE 75 MG PO TABS: 75.0000 mg | ORAL_TABLET | Freq: Every day | ORAL | 3 refills | Status: DC

## 2018-07-19 NOTE — Telephone Encounter (Signed)
°*  STAT* If patient is at the pharmacy, call can be transferred to refill team.   1. Which medications need to be refilled? (please list name of each medication and dose if known)Elopidogrel( Plavix) 75 mg   2. Which pharmacy/location (including street and city if local pharmacy) is medication to be sent to? [facility] customer service number [phone] providers [phone] member  ID [id number] plan number [phone] Rx [phone] group number [id number]   3. Do they need a 30 day or 90 day supply? 90  Patient said they are sending it to the wrong place

## 2018-08-21 DIAGNOSIS — N183 Chronic kidney disease, stage 3 (moderate): Secondary | ICD-10-CM | POA: Diagnosis not present

## 2018-10-26 DIAGNOSIS — H47321 Drusen of optic disc, right eye: Secondary | ICD-10-CM | POA: Diagnosis not present

## 2018-10-26 DIAGNOSIS — H43812 Vitreous degeneration, left eye: Secondary | ICD-10-CM | POA: Diagnosis not present

## 2018-10-26 DIAGNOSIS — H26493 Other secondary cataract, bilateral: Secondary | ICD-10-CM | POA: Diagnosis not present

## 2018-12-21 DIAGNOSIS — L821 Other seborrheic keratosis: Secondary | ICD-10-CM | POA: Diagnosis not present

## 2018-12-21 DIAGNOSIS — D1801 Hemangioma of skin and subcutaneous tissue: Secondary | ICD-10-CM | POA: Diagnosis not present

## 2018-12-21 DIAGNOSIS — L308 Other specified dermatitis: Secondary | ICD-10-CM | POA: Diagnosis not present

## 2018-12-21 DIAGNOSIS — Z85828 Personal history of other malignant neoplasm of skin: Secondary | ICD-10-CM | POA: Diagnosis not present

## 2018-12-21 DIAGNOSIS — L570 Actinic keratosis: Secondary | ICD-10-CM | POA: Diagnosis not present

## 2019-03-26 DIAGNOSIS — T63481A Toxic effect of venom of other arthropod, accidental (unintentional), initial encounter: Secondary | ICD-10-CM | POA: Diagnosis not present

## 2019-03-26 DIAGNOSIS — W57XXXA Bitten or stung by nonvenomous insect and other nonvenomous arthropods, initial encounter: Secondary | ICD-10-CM | POA: Diagnosis not present

## 2019-04-03 ENCOUNTER — Encounter (HOSPITAL_COMMUNITY): Payer: Medicare Other

## 2019-05-22 DIAGNOSIS — E7849 Other hyperlipidemia: Secondary | ICD-10-CM | POA: Diagnosis not present

## 2019-05-22 DIAGNOSIS — R7301 Impaired fasting glucose: Secondary | ICD-10-CM | POA: Diagnosis not present

## 2019-05-23 DIAGNOSIS — R82998 Other abnormal findings in urine: Secondary | ICD-10-CM | POA: Diagnosis not present

## 2019-05-29 DIAGNOSIS — Z1331 Encounter for screening for depression: Secondary | ICD-10-CM | POA: Diagnosis not present

## 2019-05-29 DIAGNOSIS — I6529 Occlusion and stenosis of unspecified carotid artery: Secondary | ICD-10-CM | POA: Diagnosis not present

## 2019-05-29 DIAGNOSIS — Z0000 Encounter for general adult medical examination without abnormal findings: Secondary | ICD-10-CM | POA: Diagnosis not present

## 2019-05-29 DIAGNOSIS — Z1339 Encounter for screening examination for other mental health and behavioral disorders: Secondary | ICD-10-CM | POA: Diagnosis not present

## 2019-05-29 DIAGNOSIS — M48061 Spinal stenosis, lumbar region without neurogenic claudication: Secondary | ICD-10-CM | POA: Diagnosis not present

## 2019-05-29 DIAGNOSIS — R7301 Impaired fasting glucose: Secondary | ICD-10-CM | POA: Diagnosis not present

## 2019-05-29 DIAGNOSIS — E785 Hyperlipidemia, unspecified: Secondary | ICD-10-CM | POA: Diagnosis not present

## 2019-05-29 DIAGNOSIS — N183 Chronic kidney disease, stage 3 (moderate): Secondary | ICD-10-CM | POA: Diagnosis not present

## 2019-06-13 ENCOUNTER — Other Ambulatory Visit: Payer: Self-pay

## 2019-06-13 ENCOUNTER — Ambulatory Visit (HOSPITAL_COMMUNITY)
Admission: RE | Admit: 2019-06-13 | Discharge: 2019-06-13 | Disposition: A | Discharge status: Home / Self Care | Payer: Medicare Other | Source: Ambulatory Visit | Attending: Cardiology | Admitting: Cardiology

## 2019-06-13 DIAGNOSIS — I6523 Occlusion and stenosis of bilateral carotid arteries: Secondary | ICD-10-CM | POA: Diagnosis not present

## 2019-06-15 ENCOUNTER — Other Ambulatory Visit: Payer: Self-pay

## 2019-06-15 DIAGNOSIS — I6523 Occlusion and stenosis of bilateral carotid arteries: Secondary | ICD-10-CM

## 2019-07-16 ENCOUNTER — Ambulatory Visit (INDEPENDENT_AMBULATORY_CARE_PROVIDER_SITE_OTHER): Payer: Medicare Other | Admitting: Cardiovascular Disease

## 2019-07-16 ENCOUNTER — Other Ambulatory Visit: Payer: Self-pay

## 2019-07-16 ENCOUNTER — Encounter: Payer: Self-pay | Admitting: Cardiovascular Disease

## 2019-07-16 VITALS — BP 126/68 | HR 89 | Temp 97.6°F | Ht 66.0 in | Wt 144.2 lb

## 2019-07-16 DIAGNOSIS — I6523 Occlusion and stenosis of bilateral carotid arteries: Secondary | ICD-10-CM | POA: Diagnosis not present

## 2019-07-16 DIAGNOSIS — Z008 Encounter for other general examination: Secondary | ICD-10-CM | POA: Diagnosis not present

## 2019-07-16 DIAGNOSIS — I6521 Occlusion and stenosis of right carotid artery: Secondary | ICD-10-CM | POA: Diagnosis not present

## 2019-07-16 NOTE — Assessment & Plan Note (Signed)
History of mild hyperlipidemia on statin therapy with lipid profile performed 05/22/2019 revealing total cholesterol 173, LDL 96 and HDL 62.

## 2019-07-16 NOTE — Assessment & Plan Note (Signed)
History of moderate right ICA stenosis by duplex ultrasound was performed 06/13/2019.  We will repeat this in 12 months.

## 2019-07-16 NOTE — Assessment & Plan Note (Signed)
Chronic. 

## 2019-07-16 NOTE — Patient Instructions (Signed)
Medication Instructions:  Your physician recommends that you continue on your current medications as directed. Please refer to the Current Medication list given to you today.  If you need a refill on your cardiac medications before your next appointment, please call your pharmacy.   Lab work: NONE If you have labs (blood work) drawn today and your tests are completely normal, you will receive your results only by: Marland Kitchen MyChart Message (if you have MyChart) OR . A paper copy in the mail If you have any lab test that is abnormal or we need to change your treatment, we will call you to review the results.  Testing/Procedures: Your physician has requested that you have a carotid duplex. This test is an ultrasound of the carotid arteries in your neck. It looks at blood flow through these arteries that supply the brain with blood. Allow one hour for this exam. There are no restrictions or special instructions.  TO BE SCHEDULED FOR 12 MONTHS  Follow-Up: At [facility], you and your health needs are our priority.  As part of our continuing mission to provide you with exceptional heart care, we have created designated Provider Care Teams.  These Care Teams include your primary Cardiologist (physician) and Advanced Practice Providers (APPs -  Physician Assistants and Nurse Practitioners) who all work together to provide you with the care you need, when you need it. You will need a follow up appointment in 12 months with Dr. Quay Burow.  Please call our office 2 months in advance to schedule this/each appointment.

## 2019-07-16 NOTE — Progress Notes (Signed)
07/16/2019 Theodore Allen   01/13/1936  [id number]  Primary Physician Marton Redwood, MD Primary Cardiologist: Lorretta Harp MD Garret Reddish, [place], [place]  HPI:  Theodore Allen is a 83 y.o.  fit-appearing married Caucasian male, father of 18, grandfather to 5 grandchildren, whom I saw  06/19/2018. We have been following an asymptomatic right internal carotid artery stenosis by duplex ultrasound on an annual basis. This has remained remarkably stable and he is neurologically asymptomatic. His other problems include hyperlipidemia. He denies chest pain or shortness of breath. His last Myoview performed 11/04/08 was normal. He also has a chronic right bundle-branch block  Since I saw him a year ago he is done well.  He occasionally gets tingling and numbness in his right arm when he wakes up in the morning which quickly goes away.  This sounds radicular.  He denies chest pain or shortness of breath.  Current Meds  Medication Sig  . Ascorbic Acid (VITAMIN C) 500 MG CAPS Take 500 mg by mouth 2 (two) times daily.   Marland Kitchen aspirin 81 MG tablet Take 81 mg by mouth daily.  . calcium-vitamin D (OSCAL WITH D) 500-200 MG-UNIT tablet Take 1 tablet by mouth.  . Cinnamon 500 MG TABS Take 500 mg by mouth daily.  . clopidogrel (PLAVIX) 75 MG tablet Take 1 tablet (75 mg total) by mouth daily.  . Coenzyme Q10 (COQ10 PO) Take 1 capsule by mouth daily.   Marland Kitchen EZETIMIBE PO   . fexofenadine (ALLEGRA) 180 MG tablet Take 180 mg by mouth daily.  . Glucosamine-Chondroitin (GLUCOSAMINE CHONDR COMPLEX PO) Take by mouth. Take 75/100 mg one tablet daily.  Marland Kitchen GRAPE SEED EXTRACT PO Take by mouth.  . Multiple Vitamin (MULTIVITAMIN) tablet Take 1 tablet by mouth daily.  . Omega-3 Fatty Acids (OMEGA 3 PO) Take 2 g by mouth daily.  [name] Glycol-Propyl Glycol (SYSTANE OP) Place 1 drop into both eyes 2 (two) times daily as needed (for dry eyes).  . Probiotic Product (PROBIOTIC DAILY PO) Take by mouth.  . rosuvastatin (CRESTOR) 20 MG  tablet Take 20 mg by mouth daily.  . TURMERIC PO Take by mouth.  . vitamin E 400 UNIT capsule Take 400 Units by mouth daily.     No Known Allergies  Social History   Socioeconomic History  . Marital status: Married    Spouse name: Not on file  . Number of children: Not on file  . Years of education: Not on file  . Highest education level: Not on file  Occupational History  . Not on file  Social Needs  . Financial resource strain: Not on file  . Food insecurity    Worry: Not on file    Inability: Not on file  . Transportation needs    Medical: Not on file    Non-medical: Not on file  Tobacco Use  . Smoking status: Never Smoker  . Smokeless tobacco: Never Used  Substance and Sexual Activity  . Alcohol use: Yes    Alcohol/week: 1.0 - 2.0 standard drinks    Types: 1 - 2 Standard drinks or equivalent per week  . Drug use: Not on file  . Sexual activity: Not on file  Lifestyle  . Physical activity    Days per week: Not on file    Minutes per session: Not on file  . Stress: Not on file  Relationships  . Social Herbalist on phone: Not on file    Gets  together: Not on file    Attends religious service: Not on file    Active member of club or organization: Not on file    Attends meetings of clubs or organizations: Not on file    Relationship status: Not on file  . Intimate partner violence    Fear of current or ex partner: Not on file    Emotionally abused: Not on file    Physically abused: Not on file    Forced sexual activity: Not on file  Other Topics Concern  . Not on file  Social History Narrative  . Not on file     Review of Systems: General: negative for chills, fever, night sweats or weight changes.  Cardiovascular: negative for chest pain, dyspnea on exertion, edema, orthopnea, palpitations, paroxysmal nocturnal dyspnea or shortness of breath Dermatological: negative for rash Respiratory: negative for cough or wheezing Urologic: negative for  hematuria Abdominal: negative for nausea, vomiting, diarrhea, bright red blood per rectum, melena, or hematemesis Neurologic: negative for visual changes, syncope, or dizziness All other systems reviewed and are otherwise negative except as noted above.    Blood pressure 126/68, pulse 89, temperature 97.6 F (36.4 C), height 5\' 6"  (1.676 m), weight 144 lb 3.2 oz (65.4 kg), SpO2 97 %.  General appearance: alert and no distress Neck: no adenopathy, no carotid bruit, no JVD, supple, symmetrical, trachea midline and thyroid not enlarged, symmetric, no tenderness/mass/nodules Lungs: clear to auscultation bilaterally Heart: regular rate and rhythm, S1, S2 normal, no murmur, click, rub or gallop Extremities: extremities normal, atraumatic, no cyanosis or edema Pulses: 2+ and symmetric Skin: Skin color, texture, turgor normal. No rashes or lesions Neurologic: Alert and oriented X 3, normal strength and tone. Normal symmetric reflexes. Normal coordination and gait  EKG sinus rhythm at 79 with right bundle branch block.  I personally reviewed this EKG.  ASSESSMENT AND PLAN:   Carotid artery disease History of moderate right ICA stenosis by duplex ultrasound was performed 06/13/2019.  We will repeat this in 12 months.  Hyperlipidemia History of mild hyperlipidemia on statin therapy with lipid profile performed 05/22/2019 revealing total cholesterol 173, LDL 96 and HDL 62.  Right bundle branch block Chronic      Lorretta Harp MD [facility], [facility] 07/16/2019 3:06 PM

## 2019-08-24 DIAGNOSIS — Z23 Encounter for immunization: Secondary | ICD-10-CM | POA: Diagnosis not present

## 2019-08-31 ENCOUNTER — Other Ambulatory Visit: Payer: Self-pay | Admitting: Cardiovascular Disease

## 2019-10-29 DIAGNOSIS — H43812 Vitreous degeneration, left eye: Secondary | ICD-10-CM | POA: Diagnosis not present

## 2019-10-29 DIAGNOSIS — H26493 Other secondary cataract, bilateral: Secondary | ICD-10-CM | POA: Diagnosis not present

## 2019-10-29 DIAGNOSIS — H04123 Dry eye syndrome of bilateral lacrimal glands: Secondary | ICD-10-CM | POA: Diagnosis not present

## 2019-12-02 ENCOUNTER — Other Ambulatory Visit: Payer: Self-pay | Admitting: *Deleted

## 2019-12-02 MED ORDER — CLOPIDOGREL BISULFATE 75 MG PO TABS: 75.0000 mg | ORAL_TABLET | Freq: Every day | ORAL | 1 refills | Status: DC

## 2019-12-02 NOTE — Telephone Encounter (Signed)
Rx has been sent to the pharmacy electronically. ° °

## 2019-12-06 ENCOUNTER — Ambulatory Visit: Payer: Medicare Other | Attending: Internal Medicine

## 2019-12-06 DIAGNOSIS — Z23 Encounter for immunization: Secondary | ICD-10-CM | POA: Diagnosis not present

## 2019-12-06 NOTE — Progress Notes (Signed)
   Covid-19 Vaccination Clinic  Name:  Theodore Allen    MRN: [id number] DOB: Jun 29, 1936  12/06/2019  Theodore Allen was observed post Covid-19 immunization for 15 minutes without incidence. He was provided with Vaccine Information Sheet and instruction to access the V-Safe system.   Theodore Allen was instructed to call 911 with any severe reactions post vaccine: Marland Kitchen Difficulty breathing  . Swelling of your face and throat  . A fast heartbeat  . A bad rash all over your body  . Dizziness and weakness    Immunizations Administered    Name Date Dose VIS Date Route   Pfizer COVID-19 Vaccine 12/06/2019 10:24 AM 0.3 mL 11/01/2019 Intramuscular   Manufacturer: [place]   Lot: [id number]   [place]: [id number]

## 2019-12-27 ENCOUNTER — Ambulatory Visit: Payer: Medicare Other | Attending: Internal Medicine

## 2019-12-27 DIAGNOSIS — Z23 Encounter for immunization: Secondary | ICD-10-CM | POA: Insufficient documentation

## 2019-12-27 NOTE — Progress Notes (Signed)
   Covid-19 Vaccination Clinic  Name:  Theodore Allen    MRN: [id number] DOB: 1936/09/08  12/27/2019  Theodore Allen was observed post Covid-19 immunization for 15 minutes without incidence. He was provided with Vaccine Information Sheet and instruction to access the V-Safe system.   Theodore Allen was instructed to call 911 with any severe reactions post vaccine: Marland Kitchen Difficulty breathing  . Swelling of your face and throat  . A fast heartbeat  . A bad rash all over your body  . Dizziness and weakness    Immunizations Administered    Name Date Dose VIS Date Route   Pfizer COVID-19 Vaccine 12/27/2019  9:32 AM 0.3 mL 11/01/2019 Intramuscular   Manufacturer: [place]   Lot: [id number]   [place]: [id number]

## 2019-12-30 DIAGNOSIS — C44212 Basal cell carcinoma of skin of right ear and external auricular canal: Secondary | ICD-10-CM | POA: Diagnosis not present

## 2019-12-30 DIAGNOSIS — Z85828 Personal history of other malignant neoplasm of skin: Secondary | ICD-10-CM | POA: Diagnosis not present

## 2019-12-30 DIAGNOSIS — L821 Other seborrheic keratosis: Secondary | ICD-10-CM | POA: Diagnosis not present

## 2019-12-30 DIAGNOSIS — L814 Other melanin hyperpigmentation: Secondary | ICD-10-CM | POA: Diagnosis not present

## 2019-12-30 DIAGNOSIS — D225 Melanocytic nevi of trunk: Secondary | ICD-10-CM | POA: Diagnosis not present

## 2019-12-30 DIAGNOSIS — D2262 Melanocytic nevi of left upper limb, including shoulder: Secondary | ICD-10-CM | POA: Diagnosis not present

## 2020-06-15 DIAGNOSIS — R7301 Impaired fasting glucose: Secondary | ICD-10-CM | POA: Diagnosis not present

## 2020-06-15 DIAGNOSIS — E7849 Other hyperlipidemia: Secondary | ICD-10-CM | POA: Diagnosis not present

## 2020-06-22 DIAGNOSIS — N529 Male erectile dysfunction, unspecified: Secondary | ICD-10-CM | POA: Diagnosis not present

## 2020-06-22 DIAGNOSIS — Z1212 Encounter for screening for malignant neoplasm of rectum: Secondary | ICD-10-CM | POA: Diagnosis not present

## 2020-06-22 DIAGNOSIS — J309 Allergic rhinitis, unspecified: Secondary | ICD-10-CM | POA: Diagnosis not present

## 2020-06-22 DIAGNOSIS — I6521 Occlusion and stenosis of right carotid artery: Secondary | ICD-10-CM | POA: Diagnosis not present

## 2020-06-22 DIAGNOSIS — R82998 Other abnormal findings in urine: Secondary | ICD-10-CM | POA: Diagnosis not present

## 2020-06-22 DIAGNOSIS — M48061 Spinal stenosis, lumbar region without neurogenic claudication: Secondary | ICD-10-CM | POA: Diagnosis not present

## 2020-06-22 DIAGNOSIS — Z0000 Encounter for general adult medical examination without abnormal findings: Secondary | ICD-10-CM | POA: Diagnosis not present

## 2020-06-22 DIAGNOSIS — R7301 Impaired fasting glucose: Secondary | ICD-10-CM | POA: Diagnosis not present

## 2020-06-22 DIAGNOSIS — G5601 Carpal tunnel syndrome, right upper limb: Secondary | ICD-10-CM | POA: Diagnosis not present

## 2020-06-22 DIAGNOSIS — E785 Hyperlipidemia, unspecified: Secondary | ICD-10-CM | POA: Diagnosis not present

## 2020-07-02 ENCOUNTER — Other Ambulatory Visit: Payer: Self-pay | Admitting: Cardiovascular Disease

## 2020-07-07 ENCOUNTER — Telehealth: Payer: Self-pay | Admitting: *Deleted

## 2020-07-07 NOTE — Telephone Encounter (Signed)
A message was left, re: his follow up visit. 

## 2020-07-14 ENCOUNTER — Ambulatory Visit (HOSPITAL_COMMUNITY)
Admission: RE | Admit: 2020-07-14 | Discharge: 2020-07-14 | Disposition: A | Discharge status: Home / Self Care | Payer: Medicare Other | Source: Ambulatory Visit | Attending: Cardiovascular Disease | Admitting: Cardiovascular Disease

## 2020-07-14 ENCOUNTER — Other Ambulatory Visit (HOSPITAL_COMMUNITY): Payer: Self-pay | Admitting: Cardiovascular Disease

## 2020-07-14 ENCOUNTER — Other Ambulatory Visit: Payer: Self-pay

## 2020-07-14 DIAGNOSIS — I6523 Occlusion and stenosis of bilateral carotid arteries: Secondary | ICD-10-CM

## 2020-07-22 ENCOUNTER — Other Ambulatory Visit: Payer: Self-pay

## 2020-07-22 ENCOUNTER — Encounter: Payer: Self-pay | Admitting: Cardiovascular Disease

## 2020-07-22 ENCOUNTER — Ambulatory Visit (INDEPENDENT_AMBULATORY_CARE_PROVIDER_SITE_OTHER): Payer: Medicare Other | Admitting: Cardiovascular Disease

## 2020-07-22 DIAGNOSIS — I4510 Unspecified right bundle-branch block: Secondary | ICD-10-CM

## 2020-07-22 DIAGNOSIS — E782 Mixed hyperlipidemia: Secondary | ICD-10-CM | POA: Diagnosis not present

## 2020-07-22 DIAGNOSIS — I6523 Occlusion and stenosis of bilateral carotid arteries: Secondary | ICD-10-CM

## 2020-07-22 DIAGNOSIS — I6521 Occlusion and stenosis of right carotid artery: Secondary | ICD-10-CM | POA: Diagnosis not present

## 2020-07-22 NOTE — Assessment & Plan Note (Signed)
Patient asymptomatic right ICA stenosis in the 50% range with recent Dopplers performed 07/14/2020 shows the same demonstrate stability.  We will continue to follow this on annual basis.  He does have a soft bruit on exam.  He is neurologically asymptomatic.

## 2020-07-22 NOTE — Assessment & Plan Note (Signed)
Chronic. 

## 2020-07-22 NOTE — Patient Instructions (Signed)
Medication Instructions:  No Changes In Medications at this time.   *If you need a refill on your cardiac medications before your next appointment, please call your pharmacy*   Lab Work: None Ordered At This Time.   If you have labs (blood work) drawn today and your tests are completely normal, you will receive your results only by: Marland Kitchen MyChart Message (if you have MyChart) OR . A paper copy in the mail If you have any lab test that is abnormal or we need to change your treatment, we will call you to review the results.  Testing/Procedures:  Your physician has requested that you have a carotid duplex in 1 year. This test is an ultrasound of the carotid arteries in your neck. It looks at blood flow through these arteries that supply the brain with blood. Allow one hour for this exam. There are no restrictions or special instructions.   Follow-Up: At [facility], you and your health needs are our priority.  As part of our continuing mission to provide you with exceptional heart care, we have created designated Provider Care Teams.  These Care Teams include your primary Cardiologist (physician) and Advanced Practice Providers (APPs -  Physician Assistants and Nurse Practitioners) who all work together to provide you with the care you need, when you need it.  Your next appointment:   1 year(s) please have Carotid Dopplers completed prior to apt   The format for your next appointment:   In Person  Provider:   Quay Burow, MD

## 2020-07-22 NOTE — Progress Notes (Signed)
07/22/2020 Kastiel Simonian   12-Sep-1936  [id number]  Primary Physician Marton Redwood, MD Primary Cardiologist: Lorretta Harp MD Lupe Carney, [place]  HPI:  Theodore Allen is a 84 y.o.  fit-appearing married Caucasian male, father of 73, grandfather to 5 grandchildren, whom I saw  07/16/2019. We have been following an asymptomatic right internal carotid artery stenosis by duplex ultrasound on an annual basis. This has remained remarkably stable and he is neurologically asymptomatic. His other problems include hyperlipidemia. He denies chest pain or shortness of breath. His last Myoview performed 11/04/08 was normal. He also has a chronic right bundle-branch block  Since I saw him a year ago he is done well.    He just moved into a new house.  He is active and does yard work.  He denies chest pain or shortness of breath.  Recent carotid Doppler studies performed 07/14/2020 revealed stable moderate right ICA stenosis.   Current Meds  Medication Sig  . Ascorbic Acid (VITAMIN C) 500 MG CAPS Take 500 mg by mouth 2 (two) times daily.   Marland Kitchen aspirin 81 MG tablet Take 81 mg by mouth daily.  . calcium-vitamin D (OSCAL WITH D) 500-200 MG-UNIT tablet Take 1 tablet by mouth.  . Cinnamon 500 MG TABS Take 500 mg by mouth daily.  . clopidogrel (PLAVIX) 75 MG tablet TAKE 1 TABLET EVERY DAY  . Coenzyme Q10 (COQ10 PO) Take 1 capsule by mouth daily.   Marland Kitchen EZETIMIBE PO   . fexofenadine (ALLEGRA) 180 MG tablet Take 180 mg by mouth daily.  . Glucosamine-Chondroitin (GLUCOSAMINE CHONDR COMPLEX PO) Take by mouth. Take 75/100 mg one tablet daily.  Marland Kitchen GRAPE SEED EXTRACT PO Take by mouth.  . Multiple Vitamin (MULTIVITAMIN) tablet Take 1 tablet by mouth daily.  . Omega-3 Fatty Acids (OMEGA 3 PO) Take 2 g by mouth daily.  [name] Glycol-Propyl Glycol (SYSTANE OP) Place 1 drop into both eyes 2 (two) times daily as needed (for dry eyes).  . Probiotic Product (PROBIOTIC DAILY PO) Take by mouth.  . rosuvastatin (CRESTOR)  20 MG tablet Take 20 mg by mouth daily.  . TURMERIC PO Take by mouth.  . vitamin E 400 UNIT capsule Take 400 Units by mouth daily.     No Known Allergies  Social History   Socioeconomic History  . Marital status: Married    Spouse name: Not on file  . Number of children: Not on file  . Years of education: Not on file  . Highest education level: Not on file  Occupational History  . Not on file  Tobacco Use  . Smoking status: Never Smoker  . Smokeless tobacco: Never Used  Substance and Sexual Activity  . Alcohol use: Yes    Alcohol/week: 1.0 - 2.0 standard drink    Types: 1 - 2 Standard drinks or equivalent per week  . Drug use: Not on file  . Sexual activity: Not on file  Other Topics Concern  . Not on file  Social History Narrative  . Not on file   Social Determinants of Health   Financial Resource Strain:   . Difficulty of Paying Living Expenses: Not on file  Food Insecurity:   . Worried About Charity fundraiser in the Last Year: Not on file  . Ran Out of Food in the Last Year: Not on file  Transportation Needs:   . Lack of Transportation (Medical): Not on file  . Lack of Transportation (Non-Medical): Not on file  Physical Activity:   . Days of Exercise per Week: Not on file  . Minutes of Exercise per Session: Not on file  Stress:   . Feeling of Stress : Not on file  Social Connections:   . Frequency of Communication with Friends and Family: Not on file  . Frequency of Social Gatherings with Friends and Family: Not on file  . Attends Religious Services: Not on file  . Active Member of Clubs or Organizations: Not on file  . Attends Archivist Meetings: Not on file  . Marital Status: Not on file  Intimate Partner Violence:   . Fear of Current or Ex-Partner: Not on file  . Emotionally Abused: Not on file  . Physically Abused: Not on file  . Sexually Abused: Not on file     Review of Systems: General: negative for chills, fever, night sweats or  weight changes.  Cardiovascular: negative for chest pain, dyspnea on exertion, edema, orthopnea, palpitations, paroxysmal nocturnal dyspnea or shortness of breath Dermatological: negative for rash Respiratory: negative for cough or wheezing Urologic: negative for hematuria Abdominal: negative for nausea, vomiting, diarrhea, bright red blood per rectum, melena, or hematemesis Neurologic: negative for visual changes, syncope, or dizziness All other systems reviewed and are otherwise negative except as noted above.    Blood pressure 140/82, pulse 67, height 5\' 6"  (1.676 m), weight 143 lb (64.9 kg).  General appearance: alert and no distress Neck: no adenopathy, no JVD, supple, symmetrical, trachea midline, thyroid not enlarged, symmetric, no tenderness/mass/nodules and Soft right carotid bruit Lungs: clear to auscultation bilaterally Heart: regular rate and rhythm, S1, S2 normal, no murmur, click, rub or gallop Extremities: extremities normal, atraumatic, no cyanosis or edema Pulses: 2+ and symmetric Skin: Skin color, texture, turgor normal. No rashes or lesions Neurologic: Alert and oriented X 3, normal strength and tone. Normal symmetric reflexes. Normal coordination and gait  EKG sinus rhythm at 67 with right bundle branch block.  I personally reviewed this EKG.  ASSESSMENT AND PLAN:   Carotid artery disease Patient asymptomatic right ICA stenosis in the 50% range with recent Dopplers performed 07/14/2020 shows the same demonstrate stability.  We will continue to follow this on annual basis.  He does have a soft bruit on exam.  He is neurologically asymptomatic.  Hyperlipidemia History of hyperlipidemia on statin therapy with lipid profile performed 06/15/2020 revealing total cholesterol 141, LDL 60 and HDL 69.  Right bundle branch block Chronic      Lorretta Harp MD [facility], [facility] 07/22/2020 2:18 PM

## 2020-07-22 NOTE — Assessment & Plan Note (Signed)
History of hyperlipidemia on statin therapy with lipid profile performed 06/15/2020 revealing total cholesterol 141, LDL 60 and HDL 69.

## 2020-08-18 ENCOUNTER — Ambulatory Visit: Payer: Medicare Other | Admitting: Cardiovascular Disease

## 2020-08-29 DIAGNOSIS — Z23 Encounter for immunization: Secondary | ICD-10-CM | POA: Diagnosis not present

## 2020-11-03 DIAGNOSIS — H47321 Drusen of optic disc, right eye: Secondary | ICD-10-CM | POA: Diagnosis not present

## 2020-12-29 DIAGNOSIS — D692 Other nonthrombocytopenic purpura: Secondary | ICD-10-CM | POA: Diagnosis not present

## 2020-12-29 DIAGNOSIS — D225 Melanocytic nevi of trunk: Secondary | ICD-10-CM | POA: Diagnosis not present

## 2020-12-29 DIAGNOSIS — C44212 Basal cell carcinoma of skin of right ear and external auricular canal: Secondary | ICD-10-CM | POA: Diagnosis not present

## 2020-12-29 DIAGNOSIS — Z85828 Personal history of other malignant neoplasm of skin: Secondary | ICD-10-CM | POA: Diagnosis not present

## 2020-12-29 DIAGNOSIS — L821 Other seborrheic keratosis: Secondary | ICD-10-CM | POA: Diagnosis not present

## 2020-12-29 DIAGNOSIS — L111 Transient acantholytic dermatosis [Grover]: Secondary | ICD-10-CM | POA: Diagnosis not present

## 2020-12-29 DIAGNOSIS — L570 Actinic keratosis: Secondary | ICD-10-CM | POA: Diagnosis not present

## 2020-12-29 DIAGNOSIS — D0421 Carcinoma in situ of skin of right ear and external auricular canal: Secondary | ICD-10-CM | POA: Diagnosis not present

## 2021-01-11 ENCOUNTER — Other Ambulatory Visit: Payer: Self-pay | Admitting: Cardiovascular Disease

## 2021-04-09 DIAGNOSIS — Z23 Encounter for immunization: Secondary | ICD-10-CM | POA: Diagnosis not present

## 2021-05-03 DIAGNOSIS — Z85828 Personal history of other malignant neoplasm of skin: Secondary | ICD-10-CM | POA: Diagnosis not present

## 2021-05-03 DIAGNOSIS — C4442 Squamous cell carcinoma of skin of scalp and neck: Secondary | ICD-10-CM | POA: Diagnosis not present

## 2021-07-01 DIAGNOSIS — R7301 Impaired fasting glucose: Secondary | ICD-10-CM | POA: Diagnosis not present

## 2021-07-01 DIAGNOSIS — Z125 Encounter for screening for malignant neoplasm of prostate: Secondary | ICD-10-CM | POA: Diagnosis not present

## 2021-07-01 DIAGNOSIS — E785 Hyperlipidemia, unspecified: Secondary | ICD-10-CM | POA: Diagnosis not present

## 2021-07-08 DIAGNOSIS — J309 Allergic rhinitis, unspecified: Secondary | ICD-10-CM | POA: Diagnosis not present

## 2021-07-08 DIAGNOSIS — M48061 Spinal stenosis, lumbar region without neurogenic claudication: Secondary | ICD-10-CM | POA: Diagnosis not present

## 2021-07-08 DIAGNOSIS — R7301 Impaired fasting glucose: Secondary | ICD-10-CM | POA: Diagnosis not present

## 2021-07-08 DIAGNOSIS — Z1331 Encounter for screening for depression: Secondary | ICD-10-CM | POA: Diagnosis not present

## 2021-07-08 DIAGNOSIS — R82998 Other abnormal findings in urine: Secondary | ICD-10-CM | POA: Diagnosis not present

## 2021-07-08 DIAGNOSIS — Z0000 Encounter for general adult medical examination without abnormal findings: Secondary | ICD-10-CM | POA: Diagnosis not present

## 2021-07-08 DIAGNOSIS — E785 Hyperlipidemia, unspecified: Secondary | ICD-10-CM | POA: Diagnosis not present

## 2021-07-08 DIAGNOSIS — G5601 Carpal tunnel syndrome, right upper limb: Secondary | ICD-10-CM | POA: Diagnosis not present

## 2021-07-08 DIAGNOSIS — Z1339 Encounter for screening examination for other mental health and behavioral disorders: Secondary | ICD-10-CM | POA: Diagnosis not present

## 2021-07-08 DIAGNOSIS — I6521 Occlusion and stenosis of right carotid artery: Secondary | ICD-10-CM | POA: Diagnosis not present

## 2021-07-13 ENCOUNTER — Other Ambulatory Visit: Payer: Self-pay

## 2021-07-13 ENCOUNTER — Other Ambulatory Visit (HOSPITAL_COMMUNITY): Payer: Self-pay | Admitting: Cardiovascular Disease

## 2021-07-13 ENCOUNTER — Ambulatory Visit (HOSPITAL_COMMUNITY)
Admission: RE | Admit: 2021-07-13 | Discharge: 2021-07-13 | Disposition: A | Discharge status: Home / Self Care | Payer: Medicare Other | Source: Ambulatory Visit | Attending: Cardiovascular Disease | Admitting: Cardiovascular Disease

## 2021-07-13 DIAGNOSIS — I6523 Occlusion and stenosis of bilateral carotid arteries: Secondary | ICD-10-CM

## 2021-07-13 DIAGNOSIS — I6521 Occlusion and stenosis of right carotid artery: Secondary | ICD-10-CM | POA: Diagnosis not present

## 2021-07-13 DIAGNOSIS — E782 Mixed hyperlipidemia: Secondary | ICD-10-CM | POA: Diagnosis not present

## 2021-07-15 ENCOUNTER — Encounter: Payer: Self-pay | Admitting: *Deleted

## 2021-07-20 ENCOUNTER — Encounter: Payer: Self-pay | Admitting: Cardiovascular Disease

## 2021-07-20 ENCOUNTER — Other Ambulatory Visit: Payer: Self-pay

## 2021-07-20 ENCOUNTER — Ambulatory Visit (INDEPENDENT_AMBULATORY_CARE_PROVIDER_SITE_OTHER): Payer: Medicare Other | Admitting: Cardiovascular Disease

## 2021-07-20 ENCOUNTER — Ambulatory Visit: Payer: Medicare Other | Admitting: Cardiovascular Disease

## 2021-07-20 DIAGNOSIS — E782 Mixed hyperlipidemia: Secondary | ICD-10-CM | POA: Diagnosis not present

## 2021-07-20 DIAGNOSIS — I6523 Occlusion and stenosis of bilateral carotid arteries: Secondary | ICD-10-CM | POA: Diagnosis not present

## 2021-07-20 DIAGNOSIS — I6521 Occlusion and stenosis of right carotid artery: Secondary | ICD-10-CM | POA: Diagnosis not present

## 2021-07-20 NOTE — Assessment & Plan Note (Signed)
History of moderate right ICA stenosis by duplex ultrasound recently performed 07/13/2021 which has remained stable.  We will repeat carotid Dopplers in 1 year.

## 2021-07-20 NOTE — Patient Instructions (Signed)
Medication Instructions:  The current medical regimen is effective;  continue present plan and medications.  *If you need a refill on your cardiac medications before your next appointment, please call your pharmacy*   Testing/Procedures: Your physician has requested that you have a carotid duplex. This test is an ultrasound of the carotid arteries in your neck. It looks at blood flow through these arteries that supply the brain with blood. Allow one hour for this exam. There are no restrictions or special instructions.   Follow-Up: At [facility], you and your health needs are our priority.  As part of our continuing mission to provide you with exceptional heart care, we have created designated Provider Care Teams.  These Care Teams include your primary Cardiologist (physician) and Advanced Practice Providers (APPs -  Physician Assistants and Nurse Practitioners) who all work together to provide you with the care you need, when you need it.  We recommend signing up for the patient portal called "MyChart".  Sign up information is provided on this After Visit Summary.  MyChart is used to connect with patients for Virtual Visits (Telemedicine).  Patients are able to view lab/test results, encounter notes, upcoming appointments, etc.  Non-urgent messages can be sent to your provider as well.   To learn more about what you can do with MyChart, go to [email or web address].    Your next appointment:   12 month(s)  The format for your next appointment:   In Person  Provider:   Quay Burow, MD

## 2021-07-20 NOTE — Progress Notes (Signed)
07/20/2021 Theodore Allen   [birth year]  [id number]  Primary Physician Brigitte Pulse Emily Filbert., MD Primary Cardiologist: Lorretta Harp MD FACP, [place], [place], [place]  HPI:  Theodore Allen is a 85 y.o.   fit-appearing married Caucasian male, father of 5, grandfather to 5 grandchildren, whom I saw    07/22/2020. We have been following an asymptomatic right internal carotid artery stenosis by duplex ultrasound on an annual basis. This has remained remarkably stable and he is neurologically asymptomatic. His other problems include hyperlipidemia. He denies chest pain or shortness of breath. His last Myoview performed 11/04/08 was normal.  He also has a chronic right bundle-branch block   Since I saw him a year ago he is done well.    He is enjoying the new house that he moved in last year and is doing yard work.  He is fairly active.  He denies chest pain or shortness of breath.  Carotid Dopplers performed 07/13/2021 revealed stable moderate right ICA stenosis.  His PCP did add Zetia to his lipid-lowering therapy which resulted in a LDL of 69.     Current Meds  Medication Sig   Ascorbic Acid (VITAMIN C) 500 MG CAPS Take 500 mg by mouth 2 (two) times daily.    aspirin 81 MG tablet Take 81 mg by mouth daily.   calcium-vitamin D (OSCAL WITH D) 500-200 MG-UNIT tablet Take 1 tablet by mouth.   Cinnamon 500 MG TABS Take 500 mg by mouth daily.   clopidogrel (PLAVIX) 75 MG tablet TAKE 1 TABLET EVERY DAY   Coenzyme Q10 (COQ10 PO) Take 1 capsule by mouth daily.    EZETIMIBE PO    fexofenadine (ALLEGRA) 180 MG tablet Take 180 mg by mouth daily.   Glucosamine-Chondroitin (GLUCOSAMINE CHONDR COMPLEX PO) Take by mouth. Take 75/100 mg one tablet daily.   GRAPE SEED EXTRACT PO Take by mouth.   Multiple Vitamin (MULTIVITAMIN) tablet Take 1 tablet by mouth daily.   Omega-3 Fatty Acids (OMEGA 3 PO) Take 2 g by mouth daily.   Polyethyl Glycol-Propyl Glycol (SYSTANE OP) Place 1 drop into both eyes 2 (two) times daily as  needed (for dry eyes).   Probiotic Product (PROBIOTIC DAILY PO) Take by mouth.   rosuvastatin (CRESTOR) 20 MG tablet Take 20 mg by mouth daily.   TURMERIC PO Take by mouth.   vitamin E 400 UNIT capsule Take 400 Units by mouth daily.     No Known Allergies  Social History   Socioeconomic History   Marital status: Married    Spouse name: Not on file   Number of children: Not on file   Years of education: Not on file   Highest education level: Not on file  Occupational History   Not on file  Tobacco Use   Smoking status: Never   Smokeless tobacco: Never  Substance and Sexual Activity   Alcohol use: Yes    Alcohol/week: 1.0 - 2.0 standard drink    Types: 1 - 2 Standard drinks or equivalent per week   Drug use: Not on file   Sexual activity: Not on file  Other Topics Concern   Not on file  Social History Narrative   Not on file   Social Determinants of Health   Financial Resource Strain: Not on file  Food Insecurity: Not on file  Transportation Needs: Not on file  Physical Activity: Not on file  Stress: Not on file  Social Connections: Not on file  Intimate Partner Violence:  Not on file     Review of Systems: General: negative for chills, fever, night sweats or weight changes.  Cardiovascular: negative for chest pain, dyspnea on exertion, edema, orthopnea, palpitations, paroxysmal nocturnal dyspnea or shortness of breath Dermatological: negative for rash Respiratory: negative for cough or wheezing Urologic: negative for hematuria Abdominal: negative for nausea, vomiting, diarrhea, bright red blood per rectum, melena, or hematemesis Neurologic: negative for visual changes, syncope, or dizziness All other systems reviewed and are otherwise negative except as noted above.    Blood pressure (!) 150/80, pulse (!) 59, height '5\' 5"'$  (1.651 m), weight 144 lb 9.6 oz (65.6 kg), SpO2 99 %.  General appearance: alert and no distress Neck: no adenopathy, no carotid bruit, no  JVD, supple, symmetrical, trachea midline, and thyroid not enlarged, symmetric, no tenderness/mass/nodules Lungs: clear to auscultation bilaterally Heart: regular rate and rhythm, S1, S2 normal, no murmur, click, rub or gallop Extremities: extremities normal, atraumatic, no cyanosis or edema Pulses: 2+ and symmetric Skin: Skin color, texture, turgor normal. No rashes or lesions Neurologic: Grossly normal  EKG sinus bradycardia 59 with right bundle branch block.  I personally reviewed this EKG.  ASSESSMENT AND PLAN:   Carotid artery disease ([place]) History of moderate right ICA stenosis by duplex ultrasound recently performed 07/13/2021 which has remained stable.  We will repeat carotid Dopplers in 1 year.  Hyperlipidemia History of hyperlipidemia on statin drugs and Zetia with lipid profile performed 07/01/2021 revealing total cholesterol 139, LDL 69 and HDL 60.  Right bundle branch block Chronic     Lorretta Harp MD [facility], [facility] 07/20/2021 8:54 AM

## 2021-07-20 NOTE — Assessment & Plan Note (Signed)
History of hyperlipidemia on statin drugs and Zetia with lipid profile performed 07/01/2021 revealing total cholesterol 139, LDL 69 and HDL 60.

## 2021-07-20 NOTE — Assessment & Plan Note (Signed)
Chronic. 

## 2021-08-09 DIAGNOSIS — Z23 Encounter for immunization: Secondary | ICD-10-CM | POA: Diagnosis not present

## 2021-09-04 DIAGNOSIS — Z23 Encounter for immunization: Secondary | ICD-10-CM | POA: Diagnosis not present

## 2021-10-20 ENCOUNTER — Other Ambulatory Visit: Payer: Self-pay | Admitting: Cardiovascular Disease

## 2021-11-09 DIAGNOSIS — H47321 Drusen of optic disc, right eye: Secondary | ICD-10-CM | POA: Diagnosis not present

## 2021-11-09 DIAGNOSIS — H26493 Other secondary cataract, bilateral: Secondary | ICD-10-CM | POA: Diagnosis not present

## 2021-12-17 DIAGNOSIS — Z23 Encounter for immunization: Secondary | ICD-10-CM | POA: Diagnosis not present

## 2021-12-29 DIAGNOSIS — D225 Melanocytic nevi of trunk: Secondary | ICD-10-CM | POA: Diagnosis not present

## 2021-12-29 DIAGNOSIS — D0422 Carcinoma in situ of skin of left ear and external auricular canal: Secondary | ICD-10-CM | POA: Diagnosis not present

## 2021-12-29 DIAGNOSIS — D044 Carcinoma in situ of skin of scalp and neck: Secondary | ICD-10-CM | POA: Diagnosis not present

## 2021-12-29 DIAGNOSIS — L821 Other seborrheic keratosis: Secondary | ICD-10-CM | POA: Diagnosis not present

## 2021-12-29 DIAGNOSIS — D1801 Hemangioma of skin and subcutaneous tissue: Secondary | ICD-10-CM | POA: Diagnosis not present

## 2021-12-29 DIAGNOSIS — L570 Actinic keratosis: Secondary | ICD-10-CM | POA: Diagnosis not present

## 2021-12-29 DIAGNOSIS — L814 Other melanin hyperpigmentation: Secondary | ICD-10-CM | POA: Diagnosis not present

## 2021-12-29 DIAGNOSIS — Z85828 Personal history of other malignant neoplasm of skin: Secondary | ICD-10-CM | POA: Diagnosis not present

## 2022-02-01 DIAGNOSIS — R051 Acute cough: Secondary | ICD-10-CM | POA: Diagnosis not present

## 2022-02-01 DIAGNOSIS — R0981 Nasal congestion: Secondary | ICD-10-CM | POA: Diagnosis not present

## 2022-02-01 DIAGNOSIS — J069 Acute upper respiratory infection, unspecified: Secondary | ICD-10-CM | POA: Diagnosis not present

## 2022-02-01 DIAGNOSIS — Z1152 Encounter for screening for COVID-19: Secondary | ICD-10-CM | POA: Diagnosis not present

## 2022-02-01 DIAGNOSIS — R5383 Other fatigue: Secondary | ICD-10-CM | POA: Diagnosis not present

## 2022-02-01 DIAGNOSIS — J309 Allergic rhinitis, unspecified: Secondary | ICD-10-CM | POA: Diagnosis not present

## 2022-07-13 ENCOUNTER — Ambulatory Visit (HOSPITAL_COMMUNITY)
Admission: RE | Admit: 2022-07-13 | Discharge: 2022-07-13 | Disposition: A | Discharge status: Home / Self Care | Payer: Medicare Other | Source: Ambulatory Visit | Attending: Cardiovascular Disease | Admitting: Cardiovascular Disease

## 2022-07-13 DIAGNOSIS — I6523 Occlusion and stenosis of bilateral carotid arteries: Secondary | ICD-10-CM

## 2022-07-20 ENCOUNTER — Encounter: Payer: Self-pay | Admitting: Cardiovascular Disease

## 2022-07-20 ENCOUNTER — Ambulatory Visit: Payer: Medicare Other | Attending: Cardiovascular Disease | Admitting: Cardiovascular Disease

## 2022-07-20 DIAGNOSIS — E782 Mixed hyperlipidemia: Secondary | ICD-10-CM | POA: Diagnosis not present

## 2022-07-20 DIAGNOSIS — I6523 Occlusion and stenosis of bilateral carotid arteries: Secondary | ICD-10-CM

## 2022-07-20 DIAGNOSIS — I6521 Occlusion and stenosis of right carotid artery: Secondary | ICD-10-CM | POA: Diagnosis not present

## 2022-07-20 NOTE — Assessment & Plan Note (Signed)
History of hyperlipidemia on Zetia and rosuvastatin with lipid profile performed 07/01/2021 revealing total cholesterol 139, LDL of 69 and HDL of 60.

## 2022-07-20 NOTE — Progress Notes (Signed)
07/20/2022 Theodore Allen   1936-04-21  [id number]  Primary Physician Brigitte Pulse Emily Filbert., MD Primary Cardiologist: Lorretta Harp MD FACP, [place], [place], [place]  HPI:  Theodore Allen is a 86 y.o.  fit-appearing married Caucasian male, father of 63, grandfather to 5 grandchildren, whom I saw    07/20/2021. We have been following an asymptomatic right internal carotid artery stenosis by duplex ultrasound on an annual basis. This has remained remarkably stable and he is neurologically asymptomatic. His other problems include hyperlipidemia. He denies chest pain or shortness of breath. His last Myoview performed 11/04/08 was normal.  He also has a chronic right bundle-branch block   Since I saw him a year ago he is done well.    He is enjoying the new house that he moved in last year and is doing yard work.  He is fairly active.  He denies chest pain or shortness of breath.  Carotid Dopplers performed 07/13/2022 revealed stable moderate right ICA stenosis.  His PCP did add Zetia to his lipid-lowering therapy which resulted in a LDL of 69.     Current Meds  Medication Sig   Ascorbic Acid (VITAMIN C) 500 MG CAPS Take 500 mg by mouth 2 (two) times daily.    aspirin 81 MG tablet Take 81 mg by mouth daily.   calcium-vitamin D (OSCAL WITH D) 500-200 MG-UNIT tablet Take 1 tablet by mouth.   Cinnamon 500 MG TABS Take 500 mg by mouth daily.   clopidogrel (PLAVIX) 75 MG tablet Take 1 tablet (75 mg total) by mouth daily.   Coenzyme Q10 (COQ10 PO) Take 1 capsule by mouth daily.    ezetimibe (ZETIA) 10 MG tablet    fexofenadine (ALLEGRA) 180 MG tablet Take 180 mg by mouth daily.   Glucosamine-Chondroitin (GLUCOSAMINE CHONDR COMPLEX PO) Take by mouth. Take 75/100 mg one tablet daily.   GRAPE SEED EXTRACT PO Take by mouth.   Multiple Vitamin (MULTIVITAMIN) tablet Take 1 tablet by mouth daily.   Omega-3 Fatty Acids (OMEGA 3 PO) Take 2 g by mouth daily.   Polyethyl Glycol-Propyl Glycol (SYSTANE OP) Place 1 drop into  both eyes 2 (two) times daily as needed (for dry eyes).   Probiotic Product (PROBIOTIC DAILY PO) Take by mouth.   rosuvastatin (CRESTOR) 20 MG tablet Take 20 mg by mouth daily.   TURMERIC PO Take by mouth.   vitamin E 400 UNIT capsule Take 400 Units by mouth daily.     No Known Allergies  Social History   Socioeconomic History   Marital status: Married    Spouse name: Not on file   Number of children: Not on file   Years of education: Not on file   Highest education level: Not on file  Occupational History   Not on file  Tobacco Use   Smoking status: Never   Smokeless tobacco: Never  Substance and Sexual Activity   Alcohol use: Yes    Alcohol/week: 1.0 - 2.0 standard drink of alcohol    Types: 1 - 2 Standard drinks or equivalent per week   Drug use: Not on file   Sexual activity: Not on file  Other Topics Concern   Not on file  Social History Narrative   Not on file   Social Determinants of Health   Financial Resource Strain: Not on file  Food Insecurity: Not on file  Transportation Needs: Not on file  Physical Activity: Not on file  Stress: Not on file  Social  Connections: Not on file  Intimate Partner Violence: Not on file     Review of Systems: General: negative for chills, fever, night sweats or weight changes.  Cardiovascular: negative for chest pain, dyspnea on exertion, edema, orthopnea, palpitations, paroxysmal nocturnal dyspnea or shortness of breath Dermatological: negative for rash Respiratory: negative for cough or wheezing Urologic: negative for hematuria Abdominal: negative for nausea, vomiting, diarrhea, bright red blood per rectum, melena, or hematemesis Neurologic: negative for visual changes, syncope, or dizziness All other systems reviewed and are otherwise negative except as noted above.    Blood pressure 130/68, pulse 74, height '5\' 6"'$  (1.676 m), weight 144 lb (65.3 kg).  General appearance: alert and no distress Neck: no adenopathy, no  carotid bruit, no JVD, supple, symmetrical, trachea midline, and thyroid not enlarged, symmetric, no tenderness/mass/nodules Lungs: clear to auscultation bilaterally Heart: regular rate and rhythm, S1, S2 normal, no murmur, click, rub or gallop Extremities: extremities normal, atraumatic, no cyanosis or edema Pulses: 2+ and symmetric Skin: Skin color, texture, turgor normal. No rashes or lesions Neurologic: Grossly normal  EKG sinus rhythm at 74 with right bundle branch block.  I personally reviewed this EKG.  ASSESSMENT AND PLAN:   Carotid artery disease ([facility]) History of moderate asymptomatic stable left ICA stenosis by duplex ultrasound most recently checked 07/13/2022.  We will recheck in 1 year.  Hyperlipidemia History of hyperlipidemia on Zetia and rosuvastatin with lipid profile performed 07/01/2021 revealing total cholesterol 139, LDL of 69 and HDL of 60.     Lorretta Harp MD FACP,FACC,FAHA, [facility] 07/20/2022 3:50 PM

## 2022-07-20 NOTE — Patient Instructions (Signed)
Medication Instructions:  Your physician recommends that you continue on your current medications as directed. Please refer to the Current Medication list given to you today.  *If you need a refill on your cardiac medications before your next appointment, please call your pharmacy*   Testing/Procedures: Your physician has requested that you have a carotid duplex. This test is an ultrasound of the carotid arteries in your neck. It looks at blood flow through these arteries that supply the brain with blood. Allow one hour for this exam. There are no restrictions or special instructions. To be done in August 2024. This procedure will be done at [address]   Follow-Up: At [facility], you and your health needs are our priority.  As part of our continuing mission to provide you with exceptional heart care, we have created designated Provider Care Teams.  These Care Teams include your primary Cardiologist (physician) and Advanced Practice Providers (APPs -  Physician Assistants and Nurse Practitioners) who all work together to provide you with the care you need, when you need it.  We recommend signing up for the patient portal called "MyChart".  Sign up information is provided on this After Visit Summary.  MyChart is used to connect with patients for Virtual Visits (Telemedicine).  Patients are able to view lab/test results, encounter notes, upcoming appointments, etc.  Non-urgent messages can be sent to your provider as well.   To learn more about what you can do with MyChart, go to [email or web address].    Your next appointment:   12 month(s)  The format for your next appointment:   In Person  Provider:   Jonathan Berry, MD  

## 2022-07-20 NOTE — Assessment & Plan Note (Signed)
History of moderate asymptomatic stable left ICA stenosis by duplex ultrasound most recently checked 07/13/2022.  We will recheck in 1 year.

## 2022-07-29 DIAGNOSIS — E785 Hyperlipidemia, unspecified: Secondary | ICD-10-CM | POA: Diagnosis not present

## 2022-07-29 DIAGNOSIS — R7301 Impaired fasting glucose: Secondary | ICD-10-CM | POA: Diagnosis not present

## 2022-07-29 DIAGNOSIS — Z125 Encounter for screening for malignant neoplasm of prostate: Secondary | ICD-10-CM | POA: Diagnosis not present

## 2022-07-29 DIAGNOSIS — R7989 Other specified abnormal findings of blood chemistry: Secondary | ICD-10-CM | POA: Diagnosis not present

## 2022-08-05 DIAGNOSIS — M48061 Spinal stenosis, lumbar region without neurogenic claudication: Secondary | ICD-10-CM | POA: Diagnosis not present

## 2022-08-05 DIAGNOSIS — Z0000 Encounter for general adult medical examination without abnormal findings: Secondary | ICD-10-CM | POA: Diagnosis not present

## 2022-08-05 DIAGNOSIS — I6521 Occlusion and stenosis of right carotid artery: Secondary | ICD-10-CM | POA: Diagnosis not present

## 2022-08-05 DIAGNOSIS — G5601 Carpal tunnel syndrome, right upper limb: Secondary | ICD-10-CM | POA: Diagnosis not present

## 2022-08-05 DIAGNOSIS — Z1339 Encounter for screening examination for other mental health and behavioral disorders: Secondary | ICD-10-CM | POA: Diagnosis not present

## 2022-08-05 DIAGNOSIS — E785 Hyperlipidemia, unspecified: Secondary | ICD-10-CM | POA: Diagnosis not present

## 2022-08-05 DIAGNOSIS — J309 Allergic rhinitis, unspecified: Secondary | ICD-10-CM | POA: Diagnosis not present

## 2022-08-05 DIAGNOSIS — R82998 Other abnormal findings in urine: Secondary | ICD-10-CM | POA: Diagnosis not present

## 2022-08-05 DIAGNOSIS — Z1331 Encounter for screening for depression: Secondary | ICD-10-CM | POA: Diagnosis not present

## 2022-08-05 DIAGNOSIS — R7301 Impaired fasting glucose: Secondary | ICD-10-CM | POA: Diagnosis not present

## 2022-08-08 DIAGNOSIS — M5451 Vertebrogenic low back pain: Secondary | ICD-10-CM | POA: Diagnosis not present

## 2022-08-11 DIAGNOSIS — M5451 Vertebrogenic low back pain: Secondary | ICD-10-CM | POA: Diagnosis not present

## 2022-08-15 DIAGNOSIS — M5451 Vertebrogenic low back pain: Secondary | ICD-10-CM | POA: Diagnosis not present

## 2022-08-19 DIAGNOSIS — M5451 Vertebrogenic low back pain: Secondary | ICD-10-CM | POA: Diagnosis not present

## 2022-08-22 DIAGNOSIS — M5451 Vertebrogenic low back pain: Secondary | ICD-10-CM | POA: Diagnosis not present

## 2022-08-24 DIAGNOSIS — M5451 Vertebrogenic low back pain: Secondary | ICD-10-CM | POA: Diagnosis not present

## 2022-08-27 DIAGNOSIS — Z23 Encounter for immunization: Secondary | ICD-10-CM | POA: Diagnosis not present

## 2022-08-29 DIAGNOSIS — M5451 Vertebrogenic low back pain: Secondary | ICD-10-CM | POA: Diagnosis not present

## 2022-09-01 DIAGNOSIS — Z23 Encounter for immunization: Secondary | ICD-10-CM | POA: Diagnosis not present

## 2022-10-31 ENCOUNTER — Other Ambulatory Visit: Payer: Self-pay | Admitting: Cardiovascular Disease

## 2022-11-10 DIAGNOSIS — H47321 Drusen of optic disc, right eye: Secondary | ICD-10-CM | POA: Diagnosis not present

## 2022-11-10 DIAGNOSIS — H43813 Vitreous degeneration, bilateral: Secondary | ICD-10-CM | POA: Diagnosis not present

## 2022-12-08 DIAGNOSIS — M65351 Trigger finger, right little finger: Secondary | ICD-10-CM | POA: Diagnosis not present

## 2022-12-08 DIAGNOSIS — R52 Pain, unspecified: Secondary | ICD-10-CM | POA: Diagnosis not present

## 2022-12-08 DIAGNOSIS — G5601 Carpal tunnel syndrome, right upper limb: Secondary | ICD-10-CM | POA: Diagnosis not present

## 2022-12-08 DIAGNOSIS — M79641 Pain in right hand: Secondary | ICD-10-CM | POA: Diagnosis not present

## 2022-12-14 ENCOUNTER — Telehealth: Payer: Self-pay | Admitting: *Deleted

## 2022-12-14 ENCOUNTER — Telehealth: Payer: Self-pay

## 2022-12-14 NOTE — Telephone Encounter (Signed)
   Name: Theodore Allen  DOB: 08/03/1936  MRN: [id number]  Primary Cardiologist: None   Preoperative team, please contact this patient and set up a phone call appointment for further preoperative risk assessment. Please obtain consent and complete medication review. Thank you for your help.  I confirm that guidance regarding antiplatelet and oral anticoagulation therapy has been completed and, if necessary, noted below.  Per office protocol, if patient is without any new symptoms or concerns at the time of their virtual visit, he may hold Plavix for 5 days prior to procedure. Please resume Plavix as soon as possible postprocedure, at the discretion of the surgeon.     Lenna Sciara, NP 12/14/2022, 4:25 PM [place]

## 2022-12-14 NOTE — Telephone Encounter (Signed)
   Pre-operative Risk Assessment    Patient Name: Theodore Allen  DOB: 1936/08/09 MRN: [id number]      Request for Surgical Clearance    Procedure:   Right carpal tunnel release, Right smaller finger A1 pulley/trigger release   Date of Surgery:  Clearance 01/06/23                                 Surgeon:  Dr. Roseanne Kaufman Surgeon's Group or Practice Name:  [facility]  Phone number:  [phone] Fax number:  [phone]   Type of Clearance Requested:   - Medical  - Pharmacy:  Hold Clopidogrel (Plavix) Please instruct    Type of Anesthesia:  Local    Additional requests/questions:    SignedJacqulynn Cadet   12/14/2022, 4:10 PM

## 2022-12-14 NOTE — Telephone Encounter (Signed)
PATIENT SCHEDULED 12-20-22 @ 10 AM  MED REC AND CONSENT SMO    Patient Consent for Virtual Visit     Theodore Allen has provided verbal consent on 12/14/2022 for a virtual visit (video or telephone).   CONSENT FOR VIRTUAL VISIT FOR:  Theodore Allen  By participating in this virtual visit I agree to the following:  I hereby voluntarily request, consent and authorize [facility] and its employed or contracted physicians, physician assistants, nurse practitioners or other licensed health care professionals (the Practitioner), to provide me with telemedicine health care services (the "Services") as deemed necessary by the treating Practitioner. I acknowledge and consent to receive the Services by the Practitioner via telemedicine. I understand that the telemedicine visit will involve communicating with the Practitioner through live audiovisual communication technology and the disclosure of certain medical information by electronic transmission. I acknowledge that I have been given the opportunity to request an in-person assessment or other available alternative prior to the telemedicine visit and am voluntarily participating in the telemedicine visit.  I understand that I have the right to withhold or withdraw my consent to the use of telemedicine in the course of my care at any time, without affecting my right to future care or treatment, and that the Practitioner or I may terminate the telemedicine visit at any time. I understand that I have the right to inspect all information obtained and/or recorded in the course of the telemedicine visit and may receive copies of available information for a reasonable fee.  I understand that some of the potential risks of receiving the Services via telemedicine include:  Delay or interruption in medical evaluation due to technological equipment failure or disruption; Information transmitted may not be sufficient (e.g. poor resolution of images) to allow for  appropriate medical decision making by the Practitioner; and/or  In rare instances, security protocols could fail, causing a breach of personal health information.  Furthermore, I acknowledge that it is my responsibility to provide information about my medical history, conditions and care that is complete and accurate to the best of my ability. I acknowledge that Practitioner's advice, recommendations, and/or decision may be based on factors not within their control, such as incomplete or inaccurate data provided by me or distortions of diagnostic images or specimens that may result from electronic transmissions. I understand that the practice of medicine is not an exact science and that Practitioner makes no warranties or guarantees regarding treatment outcomes. I acknowledge that a copy of this consent can be made available to me via my patient portal ([facility]), or I can request a printed copy by calling the office of [facility].    I understand that my insurance will be billed for this visit.   I have read or had this consent read to me. I understand the contents of this consent, which adequately explains the benefits and risks of the Services being provided via telemedicine.  I have been provided ample opportunity to ask questions regarding this consent and the Services and have had my questions answered to my satisfaction. I give my informed consent for the services to be provided through the use of telemedicine in my medical care

## 2022-12-14 NOTE — Telephone Encounter (Signed)
PATIENT SCHEDULED 12-20-22 @ 10 AM

## 2022-12-19 NOTE — Progress Notes (Unsigned)
Virtual Visit via Telephone Note   Because of Theodore Allen's co-morbid illnesses, he is at least at moderate risk for complications without adequate follow up.  This format is felt to be most appropriate for this patient at this time.  The patient did not have access to video technology/had technical difficulties with video requiring transitioning to audio format only (telephone).  All issues noted in this document were discussed and addressed.  No physical exam could be performed with this format.  Please refer to the patient's chart for his consent to telehealth for [facility].  Evaluation Performed:  Preoperative cardiovascular risk assessment _____________   Date:  12/20/2022   Patient ID:  Theodore Allen, DOB Jan 01, 1936, MRN [id number] Patient Location:  Home Provider location:   Office  Primary Care Provider:  Ginger Organ., MD Primary Cardiologist:  Quay Burow, MD  Chief Complaint / Patient Profile   87 y.o. y/o male with a h/o right internal carotid artery stenosis, HLD, and chronic RBBB who is pending right carpal tunnel release, right small finger A1 pulley/trigger release and presents today for telephonic preoperative cardiovascular risk assessment.  History of Present Illness    Theodore Allen is a 87 y.o. male who presents via audio/video conferencing for a telehealth visit today.  Pt was last seen in cardiology clinic on 07/20/22 by Dr. Gwenlyn Found.  At that time Jayvyn Haselton was doing well.  The patient is now pending procedure as outlined above. Since his last visit, he denies chest pain, shortness of breath, lower extremity edema, fatigue, palpitations, melena, hematuria, hemoptysis, diaphoresis, weakness, presyncope, syncope, orthopnea, and PND. He remains active at home and is able to achieve > 4 METS activity without any concerning cardiac symptoms.    Past Medical History    Past Medical History:  Diagnosis Date   Hyperlipidemia    Internal carotid artery  stenosis    right on duplex ultrasound    Right bundle branch block    Past Surgical History:  Procedure Laterality Date   I & D EXTREMITY Right 03/13/2015   Procedure: IRRIGATION AND DEBRIDEMENT EXTREMITY;  Surgeon: Roseanne Kaufman, MD;  Location: [facility];  Service: Orthopedics;  Laterality: Right;   I & D EXTREMITY Right 03/15/2015   Procedure: IRRIGATION AND DEBRIDEMENT RIGHT MIDDLE FINGER;  Surgeon: Roseanne Kaufman, MD;  Location: [facility];  Service: Orthopedics;  Laterality: Right;    Allergies  No Known Allergies  Home Medications    Prior to Admission medications   Medication Sig Start Date End Date Taking? Authorizing Provider  Ascorbic Acid (VITAMIN C) 500 MG CAPS Take 500 mg by mouth daily.    [provider]  aspirin 81 MG tablet Take 81 mg by mouth daily.    [provider]  calcium-vitamin D (OSCAL WITH D) 500-200 MG-UNIT tablet Take 1 tablet by mouth.    [provider]  Cinnamon 500 MG TABS Take 500 mg by mouth daily.    [provider]  clopidogrel (PLAVIX) 75 MG tablet TAKE 1 TABLET EVERY DAY 11/02/22   Lorretta Harp, MD  Coenzyme Q10 (COQ10 PO) Take 1 capsule by mouth daily.     [provider]  ezetimibe (ZETIA) 10 MG tablet  05/13/21   [provider]  fexofenadine (ALLEGRA) 180 MG tablet Take 180 mg by mouth daily.    [provider]  Glucosamine-Chondroitin (GLUCOSAMINE CHONDR COMPLEX PO) Take by mouth. Take 75/100 mg one tablet daily.    [provider]  GRAPE SEED EXTRACT PO Take by mouth.    [provider]  Multiple Vitamin (MULTIVITAMIN) tablet Take 1 tablet by mouth daily.    [provider]  Omega-3 Fatty Acids (OMEGA 3 PO) Take 2 g by mouth daily.    [provider]  Polyethyl Glycol-Propyl Glycol (SYSTANE OP) Place 1 drop into both eyes 2 (two) times daily as needed (for dry eyes).    [provider]  Probiotic Product (PROBIOTIC DAILY PO) Take by  mouth.    [provider]  rosuvastatin (CRESTOR) 20 MG tablet Take 20 mg by mouth daily.    [provider]  TURMERIC PO Take by mouth.    [provider]  vitamin E 400 UNIT capsule Take 400 Units by mouth daily.    [provider]    Physical Exam    Vital Signs:  Marquavis Hannen does not have vital signs available for review today.  Given telephonic nature of communication, physical exam is limited. AAOx3. NAD. Normal affect.  Speech and respirations are unlabored.  Accessory Clinical Findings    None  Assessment & Plan    1.  Preoperative Cardiovascular Risk Assessment: The patient is doing well from a cardiac perspective. Therefore, based on ACC/AHA guidelines, the patient would be at acceptable risk for the planned procedure without further cardiovascular testing. According to the Revised Cardiac Risk Index (RCRI), his Perioperative Risk of Major Cardiac Event is (%): 0.9 His Functional Capacity in METs is: 7.59 according to the Duke Activity Status Index (DASI).  The patient was advised that if he develops new symptoms prior to surgery to contact our office to arrange for a follow-up visit, and he verbalized understanding.  Per office protocol, he may hold Plavix for 5 days prior to procedure. Please resume Plavix as soon as possible postprocedure, at the discretion of the surgeon.   A copy of this note will be routed to requesting surgeon.  Time:   Today, I have spent 10 minutes with the patient with telehealth technology discussing medical history, symptoms, and management plan.     Emmaline Life, NP-C  12/20/2022, 9:56 AM [address] Office [phone] Fax [phone]

## 2022-12-20 ENCOUNTER — Encounter: Payer: Self-pay | Admitting: Nurse Practitioner

## 2022-12-20 ENCOUNTER — Ambulatory Visit: Payer: Medicare Other | Attending: Internal Medicine | Admitting: Nurse Practitioner

## 2022-12-20 DIAGNOSIS — Z01810 Encounter for preprocedural cardiovascular examination: Secondary | ICD-10-CM

## 2022-12-29 DIAGNOSIS — D1801 Hemangioma of skin and subcutaneous tissue: Secondary | ICD-10-CM | POA: Diagnosis not present

## 2022-12-29 DIAGNOSIS — L570 Actinic keratosis: Secondary | ICD-10-CM | POA: Diagnosis not present

## 2022-12-29 DIAGNOSIS — Z85828 Personal history of other malignant neoplasm of skin: Secondary | ICD-10-CM | POA: Diagnosis not present

## 2022-12-29 DIAGNOSIS — D044 Carcinoma in situ of skin of scalp and neck: Secondary | ICD-10-CM | POA: Diagnosis not present

## 2022-12-29 DIAGNOSIS — D485 Neoplasm of uncertain behavior of skin: Secondary | ICD-10-CM | POA: Diagnosis not present

## 2022-12-29 DIAGNOSIS — L821 Other seborrheic keratosis: Secondary | ICD-10-CM | POA: Diagnosis not present

## 2023-01-06 DIAGNOSIS — M67441 Ganglion, right hand: Secondary | ICD-10-CM | POA: Diagnosis not present

## 2023-01-06 DIAGNOSIS — G5601 Carpal tunnel syndrome, right upper limb: Secondary | ICD-10-CM | POA: Diagnosis not present

## 2023-01-06 DIAGNOSIS — M65841 Other synovitis and tenosynovitis, right hand: Secondary | ICD-10-CM | POA: Diagnosis not present

## 2023-01-06 DIAGNOSIS — M65351 Trigger finger, right little finger: Secondary | ICD-10-CM | POA: Diagnosis not present

## 2023-01-06 DIAGNOSIS — M71341 Other bursal cyst, right hand: Secondary | ICD-10-CM | POA: Diagnosis not present

## 2023-01-20 DIAGNOSIS — M25641 Stiffness of right hand, not elsewhere classified: Secondary | ICD-10-CM | POA: Diagnosis not present

## 2023-07-04 ENCOUNTER — Ambulatory Visit (HOSPITAL_COMMUNITY)
Admission: RE | Admit: 2023-07-04 | Discharge: 2023-07-04 | Disposition: A | Discharge status: Home / Self Care | Payer: Medicare Other | Source: Ambulatory Visit | Attending: Cardiovascular Disease | Admitting: Cardiovascular Disease

## 2023-07-04 DIAGNOSIS — I6521 Occlusion and stenosis of right carotid artery: Secondary | ICD-10-CM | POA: Diagnosis not present

## 2023-07-04 DIAGNOSIS — E782 Mixed hyperlipidemia: Secondary | ICD-10-CM | POA: Insufficient documentation

## 2023-07-31 DIAGNOSIS — Z23 Encounter for immunization: Secondary | ICD-10-CM | POA: Diagnosis not present

## 2023-08-01 ENCOUNTER — Ambulatory Visit: Payer: Medicare Other | Attending: Cardiovascular Disease | Admitting: Cardiovascular Disease

## 2023-08-01 ENCOUNTER — Encounter: Payer: Self-pay | Admitting: Cardiovascular Disease

## 2023-08-01 VITALS — BP 116/72 | HR 75 | Ht 66.0 in | Wt 138.0 lb

## 2023-08-01 DIAGNOSIS — E782 Mixed hyperlipidemia: Secondary | ICD-10-CM | POA: Diagnosis present

## 2023-08-01 DIAGNOSIS — I4510 Unspecified right bundle-branch block: Secondary | ICD-10-CM | POA: Insufficient documentation

## 2023-08-01 DIAGNOSIS — I6521 Occlusion and stenosis of right carotid artery: Secondary | ICD-10-CM | POA: Insufficient documentation

## 2023-08-01 NOTE — Assessment & Plan Note (Signed)
History of moderate right ICA stenosis by duplex ultrasound 07/04/2023 which has remained stable.  Will continue to follow this on annual basis.

## 2023-08-01 NOTE — Patient Instructions (Signed)
Medication Instructions:  Your physician recommends that you continue on your current medications as directed. Please refer to the Current Medication list given to you today.  *If you need a refill on your cardiac medications before your next appointment, please call your pharmacy*   Testing/Procedures: Your physician has requested that you have a carotid duplex. This test is an ultrasound of the carotid arteries in your neck. It looks at blood flow through these arteries that supply the brain with blood. Allow one hour for this exam. There are no restrictions or special instructions. This will take place at [address] [facility], [address]. To do in August 2025.    Follow-Up: At [facility], you and your health needs are our priority.  As part of our continuing mission to provide you with exceptional heart care, we have created designated Provider Care Teams.  These Care Teams include your primary Cardiologist (physician) and Advanced Practice Providers (APPs -  Physician Assistants and Nurse Practitioners) who all work together to provide you with the care you need, when you need it.  We recommend signing up for the patient portal called "MyChart".  Sign up information is provided on this After Visit Summary.  MyChart is used to connect with patients for Virtual Visits (Telemedicine).  Patients are able to view lab/test results, encounter notes, upcoming appointments, etc.  Non-urgent messages can be sent to your provider as well.   To learn more about what you can do with MyChart, go to [email or web address].    Your next appointment:   12 month(s)  Provider:   Nanetta Batty, MD

## 2023-08-01 NOTE — Assessment & Plan Note (Signed)
History of hyperlipidemia on Zetia and rosuvastatin with lipid profile performed 07/29/2022 revealing total cholesterol 153, LDL 74 and HDL of 60.

## 2023-08-01 NOTE — Assessment & Plan Note (Signed)
Chronic. 

## 2023-08-01 NOTE — Progress Notes (Signed)
08/01/2023 Theodore Allen   Mar 21, 1936  [id number]  Primary Physician Clelia Croft Netta Corrigan., MD Primary Cardiologist: Runell Gess MD FACP, [place], [place], [place]  HPI:  Lyndale Maragh is a 87 y.o.  fit-appearing married Caucasian male, father of 4, grandfather to 5 grandchildren, whom I saw 07/20/2022. We have been following an asymptomatic right internal carotid artery stenosis by duplex ultrasound on an annual basis. This has remained remarkably stable and he is neurologically asymptomatic. His other problems include hyperlipidemia. He denies chest pain or shortness of breath. His last Myoview performed 11/04/08 was normal.  He also has a chronic right bundle-branch block   Since I saw him a year ago he is done well.    He is enjoying the new house that he moved in last year and is doing yard work.  He is fairly active.  He denies chest pain or shortness of breath.  Carotid Dopplers performed 07/04/2023 revealed stable moderate right ICA stenosis.  His PCP did add Zetia to his lipid-lowering therapy which resulted in a LDL of 74.   Current Meds  Medication Sig   Ascorbic Acid (VITAMIN C) 500 MG CAPS Take 500 mg by mouth daily.   aspirin 81 MG tablet Take 81 mg by mouth daily.   calcium-vitamin D (OSCAL WITH D) 500-200 MG-UNIT tablet Take 1 tablet by mouth.   Cinnamon 500 MG TABS Take 500 mg by mouth daily.   clopidogrel (PLAVIX) 75 MG tablet TAKE 1 TABLET EVERY DAY   Coenzyme Q10 (COQ10 PO) Take 1 capsule by mouth daily.    ezetimibe (ZETIA) 10 MG tablet    fexofenadine (ALLEGRA) 180 MG tablet Take 180 mg by mouth daily.   Glucosamine-Chondroitin (GLUCOSAMINE CHONDR COMPLEX PO) Take by mouth. Take 75/100 mg one tablet daily.   GRAPE SEED EXTRACT PO Take by mouth.   Multiple Vitamin (MULTIVITAMIN) tablet Take 1 tablet by mouth daily.   Omega-3 Fatty Acids (OMEGA 3 PO) Take 2 g by mouth daily.   Polyethyl Glycol-Propyl Glycol (SYSTANE OP) Place 1 drop into both eyes 2 (two) times daily as needed  (for dry eyes).   Probiotic Product (PROBIOTIC DAILY PO) Take by mouth.   rosuvastatin (CRESTOR) 20 MG tablet Take 20 mg by mouth daily.   TURMERIC PO Take by mouth.   vitamin E 400 UNIT capsule Take 400 Units by mouth daily.     No Known Allergies  Social History   Socioeconomic History   Marital status: Married    Spouse name: Not on file   Number of children: Not on file   Years of education: Not on file   Highest education level: Not on file  Occupational History   Not on file  Tobacco Use   Smoking status: Never   Smokeless tobacco: Never  Substance and Sexual Activity   Alcohol use: Yes    Alcohol/week: 1.0 - 2.0 standard drink of alcohol    Types: 1 - 2 Standard drinks or equivalent per week   Drug use: Not on file   Sexual activity: Not on file  Other Topics Concern   Not on file  Social History Narrative   Not on file   Social Determinants of Health   Financial Resource Strain: Not on file  Food Insecurity: Not on file  Transportation Needs: Not on file  Physical Activity: Not on file  Stress: Not on file  Social Connections: Not on file  Intimate Partner Violence: Not on file  Review of Systems: General: negative for chills, fever, night sweats or weight changes.  Cardiovascular: negative for chest pain, dyspnea on exertion, edema, orthopnea, palpitations, paroxysmal nocturnal dyspnea or shortness of breath Dermatological: negative for rash Respiratory: negative for cough or wheezing Urologic: negative for hematuria Abdominal: negative for nausea, vomiting, diarrhea, bright red blood per rectum, melena, or hematemesis Neurologic: negative for visual changes, syncope, or dizziness All other systems reviewed and are otherwise negative except as noted above.    Blood pressure 116/72, pulse 75, height 5\' 6"  (1.676 m), weight 138 lb (62.6 kg).  General appearance: alert and no distress Neck: no adenopathy, no JVD, supple, symmetrical, trachea midline,  thyroid not enlarged, symmetric, no tenderness/mass/nodules, and soft right carotid bruit Lungs: clear to auscultation bilaterally Heart: Regular rate and rhythm without murmurs gallops rubs or clicks. Extremities: extremities normal, atraumatic, no cyanosis or edema Pulses: 2+ and symmetric Skin: Skin color, texture, turgor normal. No rashes or lesions Neurologic: Grossly normal  EKG normal sinus rhythm at 75 with right bundle branch block.  I personally reviewed this EKG.      ASSESSMENT AND PLAN:   Carotid artery disease (HCC) History of moderate right ICA stenosis by duplex ultrasound 07/04/2023 which has remained stable.  Will continue to follow this on annual basis.  Hyperlipidemia History of hyperlipidemia on Zetia and rosuvastatin with lipid profile performed 07/29/2022 revealing total cholesterol 153, LDL 74 and HDL of 60.  Right bundle branch block Chronic     Runell Gess MD [facility], [facility] 08/01/2023 3:44 PM

## 2023-08-17 DIAGNOSIS — Z1389 Encounter for screening for other disorder: Secondary | ICD-10-CM | POA: Diagnosis not present

## 2023-08-17 DIAGNOSIS — Z125 Encounter for screening for malignant neoplasm of prostate: Secondary | ICD-10-CM | POA: Diagnosis not present

## 2023-08-17 DIAGNOSIS — R7301 Impaired fasting glucose: Secondary | ICD-10-CM | POA: Diagnosis not present

## 2023-08-17 DIAGNOSIS — E785 Hyperlipidemia, unspecified: Secondary | ICD-10-CM | POA: Diagnosis not present

## 2023-08-22 DIAGNOSIS — J309 Allergic rhinitis, unspecified: Secondary | ICD-10-CM | POA: Diagnosis not present

## 2023-08-22 DIAGNOSIS — G5601 Carpal tunnel syndrome, right upper limb: Secondary | ICD-10-CM | POA: Diagnosis not present

## 2023-08-22 DIAGNOSIS — I6521 Occlusion and stenosis of right carotid artery: Secondary | ICD-10-CM | POA: Diagnosis not present

## 2023-08-22 DIAGNOSIS — R7301 Impaired fasting glucose: Secondary | ICD-10-CM | POA: Diagnosis not present

## 2023-08-22 DIAGNOSIS — Z1339 Encounter for screening examination for other mental health and behavioral disorders: Secondary | ICD-10-CM | POA: Diagnosis not present

## 2023-08-22 DIAGNOSIS — M48061 Spinal stenosis, lumbar region without neurogenic claudication: Secondary | ICD-10-CM | POA: Diagnosis not present

## 2023-08-22 DIAGNOSIS — Z0000 Encounter for general adult medical examination without abnormal findings: Secondary | ICD-10-CM | POA: Diagnosis not present

## 2023-08-22 DIAGNOSIS — Z23 Encounter for immunization: Secondary | ICD-10-CM | POA: Diagnosis not present

## 2023-08-22 DIAGNOSIS — E785 Hyperlipidemia, unspecified: Secondary | ICD-10-CM | POA: Diagnosis not present

## 2023-08-22 DIAGNOSIS — Z1331 Encounter for screening for depression: Secondary | ICD-10-CM | POA: Diagnosis not present

## 2023-08-22 DIAGNOSIS — R82998 Other abnormal findings in urine: Secondary | ICD-10-CM | POA: Diagnosis not present

## 2023-08-26 DIAGNOSIS — Z23 Encounter for immunization: Secondary | ICD-10-CM | POA: Diagnosis not present

## 2023-09-20 DIAGNOSIS — Z85828 Personal history of other malignant neoplasm of skin: Secondary | ICD-10-CM | POA: Diagnosis not present

## 2023-09-20 DIAGNOSIS — L570 Actinic keratosis: Secondary | ICD-10-CM | POA: Diagnosis not present

## 2023-10-26 DIAGNOSIS — D485 Neoplasm of uncertain behavior of skin: Secondary | ICD-10-CM | POA: Diagnosis not present

## 2023-10-26 DIAGNOSIS — L570 Actinic keratosis: Secondary | ICD-10-CM | POA: Diagnosis not present

## 2023-10-26 DIAGNOSIS — Z85828 Personal history of other malignant neoplasm of skin: Secondary | ICD-10-CM | POA: Diagnosis not present

## 2023-12-05 DIAGNOSIS — H47321 Drusen of optic disc, right eye: Secondary | ICD-10-CM | POA: Diagnosis not present

## 2023-12-20 ENCOUNTER — Other Ambulatory Visit: Payer: Self-pay | Admitting: Cardiovascular Disease

## 2024-01-02 DIAGNOSIS — L814 Other melanin hyperpigmentation: Secondary | ICD-10-CM | POA: Diagnosis not present

## 2024-01-02 DIAGNOSIS — D225 Melanocytic nevi of trunk: Secondary | ICD-10-CM | POA: Diagnosis not present

## 2024-01-02 DIAGNOSIS — Z85828 Personal history of other malignant neoplasm of skin: Secondary | ICD-10-CM | POA: Diagnosis not present

## 2024-01-02 DIAGNOSIS — D1801 Hemangioma of skin and subcutaneous tissue: Secondary | ICD-10-CM | POA: Diagnosis not present

## 2024-01-02 DIAGNOSIS — L570 Actinic keratosis: Secondary | ICD-10-CM | POA: Diagnosis not present

## 2024-01-02 DIAGNOSIS — D485 Neoplasm of uncertain behavior of skin: Secondary | ICD-10-CM | POA: Diagnosis not present

## 2024-01-02 DIAGNOSIS — D044 Carcinoma in situ of skin of scalp and neck: Secondary | ICD-10-CM | POA: Diagnosis not present

## 2024-01-02 DIAGNOSIS — L430 Hypertrophic lichen planus: Secondary | ICD-10-CM | POA: Diagnosis not present

## 2024-06-19 ENCOUNTER — Encounter (HOSPITAL_COMMUNITY): Payer: Self-pay

## 2024-07-03 ENCOUNTER — Ambulatory Visit (HOSPITAL_COMMUNITY)
Admission: RE | Admit: 2024-07-03 | Discharge: 2024-07-03 | Disposition: A | Discharge status: Home / Self Care | Source: Ambulatory Visit | Attending: Cardiovascular Disease | Admitting: Cardiovascular Disease

## 2024-07-03 ENCOUNTER — Ambulatory Visit: Payer: Self-pay | Admitting: Cardiovascular Disease

## 2024-07-03 DIAGNOSIS — E782 Mixed hyperlipidemia: Secondary | ICD-10-CM | POA: Insufficient documentation

## 2024-07-03 DIAGNOSIS — I6521 Occlusion and stenosis of right carotid artery: Secondary | ICD-10-CM | POA: Diagnosis not present

## 2024-07-30 ENCOUNTER — Encounter: Payer: Self-pay | Admitting: Cardiovascular Disease

## 2024-07-30 ENCOUNTER — Ambulatory Visit: Attending: Cardiovascular Disease | Admitting: Cardiovascular Disease

## 2024-07-30 VITALS — BP 149/73 | HR 71 | Ht 66.0 in | Wt 144.7 lb

## 2024-07-30 DIAGNOSIS — I6521 Occlusion and stenosis of right carotid artery: Secondary | ICD-10-CM | POA: Insufficient documentation

## 2024-07-30 DIAGNOSIS — E782 Mixed hyperlipidemia: Secondary | ICD-10-CM | POA: Diagnosis not present

## 2024-07-30 DIAGNOSIS — I4510 Unspecified right bundle-branch block: Secondary | ICD-10-CM | POA: Insufficient documentation

## 2024-07-30 NOTE — Assessment & Plan Note (Signed)
 History of asymptomatic left ICA stenosis unchanged by duplex 07/03/2024.  Will we will continue to follow this noninvasively.

## 2024-07-30 NOTE — Patient Instructions (Addendum)
 Medication Instructions:  Your physician recommends that you continue on your current medications as directed. Please refer to the Current Medication list given to you today.  *If you need a refill on your cardiac medications before your next appointment, please call your pharmacy*   Testing/Procedures: Your physician has requested that you have a carotid duplex. This test is an ultrasound of the carotid arteries in your neck. It looks at blood flow through these arteries that supply the brain with blood. Allow one hour for this exam. There are no restrictions or special instructions. This will take place at [address], [address]  Please note: We ask at that you not bring children with you during ultrasound (echo/ vascular) testing. Due to room size and safety concerns, children are not allowed in the ultrasound rooms during exams. Our front office staff cannot provide observation of children in our lobby area while testing is being conducted. An adult accompanying a patient to their appointment will only be allowed in the ultrasound room at the discretion of the ultrasound technician under special circumstances. We apologize for any inconvenience. **To do in August 2026**    Follow-Up: At [facility], you and your health needs are our priority.  As part of our continuing mission to provide you with exceptional heart care, our providers are all part of one team.  This team includes your primary Cardiologist (physician) and Advanced Practice Providers or APPs (Physician Assistants and Nurse Practitioners) who all work together to provide you with the care you need, when you need it.  Your next appointment:   12 month(s)  Provider:   Dorn Lesches, MD

## 2024-07-30 NOTE — Assessment & Plan Note (Addendum)
 History of hyperlipidemia on statin therapy and Zetia with lipid profile performed 08/18/2023 revealing total cholesterol 131, LDL 46 and HDL 70.

## 2024-07-30 NOTE — Assessment & Plan Note (Signed)
 Chronic

## 2024-07-30 NOTE — Progress Notes (Signed)
 07/30/2024 Theodore Allen   Jun 05, 1936  [id number]  Primary Physician Loreli Elsie JONETTA Raddle., MD Primary Cardiologist: Dorn JINNY Lesches MD FACP, [place], [place], [place]  HPI:  Theodore Allen is a 88 y.o.   fit-appearing married Caucasian male, father of 4, grandfather to 5 grandchildren, whom I saw 07/20/2022. We have been following an asymptomatic right internal carotid artery stenosis by duplex ultrasound on an annual basis. This has remained remarkably stable and he is neurologically asymptomatic. His other problems include hyperlipidemia. He denies chest pain or shortness of breath. His last Myoview performed 11/04/08 was normal.  He also has a chronic right bundle-branch block   Since I saw him a year ago he is done well.    He is enjoying the new house that he moved in last year and is doing yard work.  He is fairly active.  He denies chest pain or shortness of breath.  Carotid Dopplers performed 07/03/2024 revealed moderate left ICA stenosis unchanged from his prior Doppler study.   Current Meds  Medication Sig   Ascorbic Acid  (VITAMIN C ) 500 MG CAPS Take 500 mg by mouth daily.   aspirin 81 MG tablet Take 81 mg by mouth daily.   calcium -vitamin D (OSCAL WITH D) 500-200 MG-UNIT tablet Take 1 tablet by mouth.   Cinnamon 500 MG TABS Take 500 mg by mouth daily.   clopidogrel  (PLAVIX ) 75 MG tablet TAKE 1 TABLET EVERY DAY   Coenzyme Q10 (COQ10 PO) Take 1 capsule by mouth daily.    ezetimibe (ZETIA) 10 MG tablet    fexofenadine (ALLEGRA) 180 MG tablet Take 180 mg by mouth daily.   Glucosamine-Chondroitin (GLUCOSAMINE CHONDR COMPLEX PO) Take by mouth. Take 75/100 mg one tablet daily.   GRAPE SEED EXTRACT PO Take by mouth.   Multiple Vitamin (MULTIVITAMIN) tablet Take 1 tablet by mouth daily.   Omega-3 Fatty Acids (OMEGA 3 PO) Take 2 g by mouth daily.   Polyethyl Glycol-Propyl Glycol (SYSTANE OP) Place 1 drop into both eyes 2 (two) times daily as needed (for dry eyes).   Probiotic Product (PROBIOTIC DAILY  PO) Take by mouth.   rosuvastatin  (CRESTOR ) 20 MG tablet Take 20 mg by mouth daily.   TURMERIC PO Take by mouth.   vitamin E 400 UNIT capsule Take 400 Units by mouth daily.     No Known Allergies  Social History   Socioeconomic History   Marital status: Married    Spouse name: Not on file   Number of children: Not on file   Years of education: Not on file   Highest education level: Not on file  Occupational History   Not on file  Tobacco Use   Smoking status: Never   Smokeless tobacco: Never  Substance and Sexual Activity   Alcohol  use: Yes    Alcohol /week: 1.0 - 2.0 standard drink of alcohol     Types: 1 - 2 Standard drinks or equivalent per week   Drug use: Not on file   Sexual activity: Not on file  Other Topics Concern   Not on file  Social History Narrative   Not on file   Social Drivers of Health   Financial Resource Strain: Not on file  Food Insecurity: Not on file  Transportation Needs: Not on file  Physical Activity: Not on file  Stress: Not on file  Social Connections: Not on file  Intimate Partner Violence: Not on file     Review of Systems: General: negative for chills, fever, night  sweats or weight changes.  Cardiovascular: negative for chest pain, dyspnea on exertion, edema, orthopnea, palpitations, paroxysmal nocturnal dyspnea or shortness of breath Dermatological: negative for rash Respiratory: negative for cough or wheezing Urologic: negative for hematuria Abdominal: negative for nausea, vomiting, diarrhea, bright red blood per rectum, melena, or hematemesis Neurologic: negative for visual changes, syncope, or dizziness All other systems reviewed and are otherwise negative except as noted above.    Blood pressure (!) 149/73, pulse 71, height 5' 6 (1.676 m), weight 144 lb 11.2 oz (65.6 kg), SpO2 97%.  General appearance: alert and no distress Neck: no adenopathy, no carotid bruit, no JVD, supple, symmetrical, trachea midline, and thyroid  not  enlarged, symmetric, no tenderness/mass/nodules Lungs: clear to auscultation bilaterally Heart: regular rate and rhythm, S1, S2 normal, no murmur, click, rub or gallop Extremities: extremities normal, atraumatic, no cyanosis or edema Pulses: 2+ and symmetric Skin: Skin color, texture, turgor normal. No rashes or lesions Neurologic: Grossly normal  EKG EKG Interpretation Date/Time:  Tuesday July 30 2024 09:27:30 EDT Ventricular Rate:  71 PR Interval:  204 QRS Duration:  140 QT Interval:  440 QTC Calculation: 478 R Axis:   33  Text Interpretation: Normal sinus rhythm Right bundle branch block When compared with ECG of 13-Mar-2015 18:03, PREVIOUS ECG IS PRESENT Confirmed by Court Carrier [phone]) on 07/30/2024 9:32:02 AM    ASSESSMENT AND PLAN:   Carotid artery disease (HCC) History of asymptomatic left ICA stenosis unchanged by duplex 07/03/2024.  Will we will continue to follow this noninvasively.  Hyperlipidemia History of hyperlipidemia on statin therapy lipid profile performed 08/18/2023 revealing total cholesterol 131, LDL 46 and HDL 70.  Right bundle branch block Chronic     Carrier DOROTHA Court MD [facility], [facility] 07/30/2024 9:38 AM

## 2024-08-24 DIAGNOSIS — Z23 Encounter for immunization: Secondary | ICD-10-CM | POA: Diagnosis not present

## 2024-09-05 DIAGNOSIS — Z79899 Other long term (current) drug therapy: Secondary | ICD-10-CM | POA: Diagnosis not present

## 2024-09-05 DIAGNOSIS — Z0189 Encounter for other specified special examinations: Secondary | ICD-10-CM | POA: Diagnosis not present

## 2024-09-05 DIAGNOSIS — E785 Hyperlipidemia, unspecified: Secondary | ICD-10-CM | POA: Diagnosis not present

## 2024-09-05 DIAGNOSIS — Z125 Encounter for screening for malignant neoplasm of prostate: Secondary | ICD-10-CM | POA: Diagnosis not present

## 2024-09-05 DIAGNOSIS — R7301 Impaired fasting glucose: Secondary | ICD-10-CM | POA: Diagnosis not present

## 2024-09-11 ENCOUNTER — Other Ambulatory Visit: Payer: Self-pay | Admitting: Cardiovascular Disease

## 2024-09-12 DIAGNOSIS — Z1331 Encounter for screening for depression: Secondary | ICD-10-CM | POA: Diagnosis not present

## 2024-09-12 DIAGNOSIS — M48061 Spinal stenosis, lumbar region without neurogenic claudication: Secondary | ICD-10-CM | POA: Diagnosis not present

## 2024-09-12 DIAGNOSIS — N529 Male erectile dysfunction, unspecified: Secondary | ICD-10-CM | POA: Diagnosis not present

## 2024-09-12 DIAGNOSIS — I6521 Occlusion and stenosis of right carotid artery: Secondary | ICD-10-CM | POA: Diagnosis not present

## 2024-09-12 DIAGNOSIS — N1831 Chronic kidney disease, stage 3a: Secondary | ICD-10-CM | POA: Diagnosis not present

## 2024-09-12 DIAGNOSIS — R7301 Impaired fasting glucose: Secondary | ICD-10-CM | POA: Diagnosis not present

## 2024-09-12 DIAGNOSIS — J309 Allergic rhinitis, unspecified: Secondary | ICD-10-CM | POA: Diagnosis not present

## 2024-09-12 DIAGNOSIS — E785 Hyperlipidemia, unspecified: Secondary | ICD-10-CM | POA: Diagnosis not present

## 2024-09-12 DIAGNOSIS — Z0000 Encounter for general adult medical examination without abnormal findings: Secondary | ICD-10-CM | POA: Diagnosis not present

## 2024-09-12 DIAGNOSIS — G629 Polyneuropathy, unspecified: Secondary | ICD-10-CM | POA: Diagnosis not present

## 2024-09-12 DIAGNOSIS — Z1339 Encounter for screening examination for other mental health and behavioral disorders: Secondary | ICD-10-CM | POA: Diagnosis not present

## 2024-09-12 DIAGNOSIS — R82998 Other abnormal findings in urine: Secondary | ICD-10-CM | POA: Diagnosis not present

## 2024-09-17 DIAGNOSIS — Z23 Encounter for immunization: Secondary | ICD-10-CM | POA: Diagnosis not present
